# Patient Record
Sex: Male | Born: 1975 | Race: White | Hispanic: No | Marital: Single | Smoking: Former smoker
Health system: Southern US, Community
[De-identification: ages and names within clinical notes are randomized; demographics above are authoritative.]

## PROBLEM LIST (undated history)

## (undated) DIAGNOSIS — F988 Other specified behavioral and emotional disorders with onset usually occurring in childhood and adolescence: Secondary | ICD-10-CM

---

## 2011-11-17 ENCOUNTER — Telehealth: Payer: Self-pay

## 2011-11-17 DIAGNOSIS — F988 Other specified behavioral and emotional disorders with onset usually occurring in childhood and adolescence: Secondary | ICD-10-CM

## 2011-11-17 NOTE — Telephone Encounter (Signed)
.  [facility] PATIENT IS REQUESTING A REFILL ON HIS ADDERALL 30MG . HE USUALLY SEES DR. Neva Seat. PLEASE CALL HIM WHEN IT IS READY TO BE PICKED UP. HE MAY HAVE TO HAVE HIS ROOMMATE PICK IT UP FOR HIM.  HER NAME IS SARAH SADLER. SHE WILL HAVE HER ID WITH HER. BEST PHONE [phone]  MBC

## 2011-11-18 MED ORDER — AMPHETAMINE-DEXTROAMPHETAMINE 30 MG PO TABS: ORAL_TABLET | ORAL | Status: DC

## 2011-11-18 NOTE — Telephone Encounter (Signed)
Rx written and up front for pick up. Please notify pt.

## 2011-11-19 ENCOUNTER — Telehealth: Payer: Self-pay

## 2011-11-19 NOTE — Telephone Encounter (Signed)
Notified pt that his Adderall Rx is ready for p/up.

## 2011-11-19 NOTE — Telephone Encounter (Signed)
Left message on machine that prescription is ready for pick up.

## 2011-12-17 ENCOUNTER — Telehealth: Payer: Self-pay

## 2011-12-17 DIAGNOSIS — F988 Other specified behavioral and emotional disorders with onset usually occurring in childhood and adolescence: Secondary | ICD-10-CM

## 2011-12-17 NOTE — Telephone Encounter (Signed)
Pt requests refill on adderall  Best: [phone] bf

## 2011-12-18 MED ORDER — AMPHETAMINE-DEXTROAMPHETAMINE 30 MG PO TABS: ORAL_TABLET | ORAL | Status: DC

## 2011-12-18 NOTE — Telephone Encounter (Signed)
Spoke with pt advised RX ready to be picked up. 

## 2011-12-18 NOTE — Telephone Encounter (Signed)
Signed at TL desk.  

## 2012-01-10 ENCOUNTER — Telehealth: Payer: Self-pay

## 2012-01-10 NOTE — Telephone Encounter (Signed)
PT IN NEED OF HIS ADDERALL. PLEASE CALL [phone]

## 2012-01-11 NOTE — Telephone Encounter (Signed)
Pull chart please.  Nasra Counce 

## 2012-01-14 ENCOUNTER — Telehealth: Payer: Self-pay | Admitting: Internal Medicine

## 2012-01-14 DIAGNOSIS — F988 Other specified behavioral and emotional disorders with onset usually occurring in childhood and adolescence: Secondary | ICD-10-CM

## 2012-01-14 MED ORDER — AMPHETAMINE-DEXTROAMPHETAMINE 30 MG PO TABS: ORAL_TABLET | ORAL | Status: DC

## 2012-01-14 NOTE — Telephone Encounter (Signed)
Called pt advised to pick up RX and reminded him he need follow up. Pt understood.

## 2012-01-14 NOTE — Telephone Encounter (Signed)
ADDERALL RF, may be filled January 18, 2012.  DUE FOR OV FOR MORE REFILLS, please call pt to pick-up

## 2012-01-14 NOTE — Telephone Encounter (Signed)
This was already done

## 2012-01-14 NOTE — Telephone Encounter (Signed)
Chart pulled to PA 

## 2012-01-18 ENCOUNTER — Ambulatory Visit (INDEPENDENT_AMBULATORY_CARE_PROVIDER_SITE_OTHER): Payer: 59 | Admitting: Family Medicine

## 2012-01-18 ENCOUNTER — Encounter: Payer: Self-pay | Admitting: Family Medicine

## 2012-01-18 VITALS — BP 131/83 | HR 106 | Temp 98.4°F | Resp 17 | Ht 70.0 in | Wt 175.6 lb

## 2012-01-18 DIAGNOSIS — F4320 Adjustment disorder, unspecified: Secondary | ICD-10-CM

## 2012-01-18 DIAGNOSIS — F988 Other specified behavioral and emotional disorders with onset usually occurring in childhood and adolescence: Secondary | ICD-10-CM

## 2012-01-18 MED ORDER — AMPHETAMINE-DEXTROAMPHETAMINE 30 MG PO TABS: ORAL_TABLET | ORAL | Status: DC

## 2012-01-18 MED ORDER — CLONAZEPAM 0.5 MG PO TABS: 0.5000 mg | ORAL_TABLET | Freq: Two times a day (BID) | ORAL | Status: DC | PRN

## 2012-01-18 NOTE — Progress Notes (Signed)
  Subjective:    Patient ID: Joseph Blankenship, male    DOB: 1976-05-26, 36 y.o.   MRN: [id number]  HPI Joseph Blankenship is a 37 y.o. male Hx ADD. On adderrall 30mg  qam,  30mg  at 1 pm, then 1/2 prn in evenings. Feeling more anxious lately - stress with job. 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  Met with superintendent this am - difficult problem.  Stress with work and up late d/t school. Multiple applications out - but wants to stay - graduating next June.   No previous depression/anxiety or meds.  Does not want to treat long term with meds.  May be open to talk to therapist.    Review of Systems  Respiratory: Negative for chest tightness.   Cardiovascular: Negative for palpitations.  Psychiatric/Behavioral: Negative for suicidal ideas. The patient is nervous/anxious.        Objective:   Physical Exam  Constitutional: He is oriented to person, place, and time. He appears well-developed and well-nourished.  HENT:  Head: Normocephalic and atraumatic.  Cardiovascular: Normal rate, normal heart sounds and intact distal pulses.   Pulmonary/Chest: Effort normal and breath sounds normal.  Neurological: He is alert and oriented to person, place, and time.  Skin: Skin is warm and dry.  Psychiatric: He has a normal mood and affect. His speech is normal and behavior is normal. Judgment normal. Cognition and memory are normal. He expresses no homicidal and no suicidal ideation. He expresses no suicidal plans and no homicidal plans.   Assessment & Plan:  Joseph Blankenship is a 36 y.o. male   ADD -- stable - cont current dosing.  Just had refill 1 week ago?  Will check into number of meds left, and to see if he will have enough until may 5th Rx.  If he needs one week of meds prior to May 5 prescription that can be called in.    Anxiety/adjustment disorder - suspect adjustment with stressors at work. Denies history of depression or anxiety in the past.  Does have a good outlook for  jobs in the future,  however does not want to change current position. Will prescribe short course of Klonopin 0.5mg  BID prn.- sed.   Phone number for Joseph Blankenship given to patient for discussion of coping techniques and management of current situation.

## 2012-01-23 ENCOUNTER — Telehealth: Payer: Self-pay

## 2012-01-23 NOTE — Telephone Encounter (Signed)
PT WANTED DR Neva Seat TO KNOW THAT THE NUMBER HE WAS GIVEN IS THE ONE DISCONNECTED. WOULD LIKE TO HAVE SOMETHING ELSE PLEASE CALL [phone]

## 2012-01-24 NOTE — Telephone Encounter (Signed)
Dr Neva Seat, after reviewing your OV notes, it looks like the number given for Joseph Blankenship is DCd. I tried to get another number by Googling his name and found three phone numbers in total, and all have been DCd ([phone], [phone], [phone]). Do you know of any other way to reach him, or do you want to recommend another therapist?

## 2012-01-25 NOTE — Telephone Encounter (Signed)
He can try Karmen Bongo at [phone] or Vernell Leep at [phone]

## 2012-01-26 NOTE — Telephone Encounter (Signed)
GAVE PT BOTH NAMES OF THERAPIST TO TRY

## 2012-02-06 ENCOUNTER — Telehealth: Payer: Self-pay

## 2012-02-06 NOTE — Telephone Encounter (Signed)
Per phone message he wants add meds to get him through 02/17/12 and states he already has his rx for may.  ???????

## 2012-02-06 NOTE — Telephone Encounter (Signed)
Patient took the medicine wrong so he ran out early.

## 2012-02-06 NOTE — Telephone Encounter (Signed)
Pt is requesting rx for adderall just to get him thru til may 5th, he states he has his rx already for may.

## 2012-02-22 ENCOUNTER — Ambulatory Visit: Payer: 59 | Admitting: Family Medicine

## 2012-02-29 ENCOUNTER — Ambulatory Visit (INDEPENDENT_AMBULATORY_CARE_PROVIDER_SITE_OTHER): Payer: 59 | Admitting: Family Medicine

## 2012-02-29 ENCOUNTER — Encounter: Payer: Self-pay | Admitting: Family Medicine

## 2012-02-29 VITALS — BP 113/78 | HR 105 | Temp 97.8°F | Resp 16 | Ht 69.5 in | Wt 176.6 lb

## 2012-02-29 DIAGNOSIS — F419 Anxiety disorder, unspecified: Secondary | ICD-10-CM

## 2012-02-29 DIAGNOSIS — F411 Generalized anxiety disorder: Secondary | ICD-10-CM

## 2012-02-29 DIAGNOSIS — F988 Other specified behavioral and emotional disorders with onset usually occurring in childhood and adolescence: Secondary | ICD-10-CM

## 2012-02-29 MED ORDER — CLONAZEPAM 0.5 MG PO TABS: 0.5000 mg | ORAL_TABLET | Freq: Two times a day (BID) | ORAL | Status: DC | PRN

## 2012-02-29 NOTE — Progress Notes (Signed)
  Subjective:    Patient ID: Joseph Blankenship, male    DOB: 01-16-1976, 36 y.o.   MRN: [id number]  HPI Joseph Blankenship is a 36 y.o. male Hx ADD and anxiety. See last ov  01/23/12.   stress with work environment.  ADD -- stable prior  - continued current dosing (adderrall 30mg  qam,  30mg  at 1 pm, then 1/2 prn in evenings).    Anxiety/adjustment disorder - Klonopin 0.5mg  BID prn prescribed last ov - took 2 on some days, usually 2-3 per day, but doesn't want to develop a dependency.    Phone number for therapists given. Therapist - Karmen Bongo - 2 visits.  Working on coping mechanisms. Feels more introspective and able to deal with stressors better.  Last klonopin. Stress relief - breathing exercises, reading.   Review of Systems  Psychiatric/Behavioral: Negative for dysphoric mood and decreased concentration. The patient is nervous/anxious.       Objective:   Physical Exam  Constitutional: He is oriented to person, place, and time. He appears well-developed and well-nourished.  HENT:  Head: Normocephalic and atraumatic.  Pulmonary/Chest: Effort normal.  Neurological: He is alert and oriented to person, place, and time.  Psychiatric: He has a normal mood and affect. His behavior is normal. Judgment and thought content normal.          Assessment & Plan:  Joseph Blankenship is a 36 y.o. male  Anxiety/work stressors - improved with coping mechanisms.  Graduates in about a year, and will be in a new position after that time, as well as other opportunities, so has an improved outlook.. Continue Klonopin only as needed for higher stress days #20, no refills.  Recheck in 4-6 weeks.

## 2012-04-04 ENCOUNTER — Encounter: Payer: Self-pay | Admitting: Family Medicine

## 2012-04-04 ENCOUNTER — Ambulatory Visit (INDEPENDENT_AMBULATORY_CARE_PROVIDER_SITE_OTHER): Payer: 59 | Admitting: Family Medicine

## 2012-04-04 VITALS — BP 127/80 | HR 99 | Temp 97.8°F | Resp 16 | Ht 69.0 in | Wt 177.0 lb

## 2012-04-04 DIAGNOSIS — F411 Generalized anxiety disorder: Secondary | ICD-10-CM

## 2012-04-04 DIAGNOSIS — H60339 Swimmer's ear, unspecified ear: Secondary | ICD-10-CM

## 2012-04-04 DIAGNOSIS — F988 Other specified behavioral and emotional disorders with onset usually occurring in childhood and adolescence: Secondary | ICD-10-CM

## 2012-04-04 DIAGNOSIS — F419 Anxiety disorder, unspecified: Secondary | ICD-10-CM

## 2012-04-04 DIAGNOSIS — Z91030 Bee allergy status: Secondary | ICD-10-CM

## 2012-04-04 DIAGNOSIS — H6090 Unspecified otitis externa, unspecified ear: Secondary | ICD-10-CM

## 2012-04-04 MED ORDER — EPINEPHRINE 0.3 MG/0.3ML IJ DEVI: 0.3000 mg | Freq: Once | INTRAMUSCULAR | Status: AC

## 2012-04-04 MED ORDER — OFLOXACIN 0.3 % OT SOLN: 10.0000 [drp] | Freq: Every day | OTIC | Status: AC

## 2012-04-04 MED ORDER — CLONAZEPAM 0.5 MG PO TABS: 0.5000 mg | ORAL_TABLET | Freq: Two times a day (BID) | ORAL | Status: DC | PRN

## 2012-04-04 NOTE — Progress Notes (Signed)
  Subjective:    Patient ID: Joseph Blankenship, male    DOB: 1976/03/17, 36 y.o.   MRN: [id number]  HPI Joseph Blankenship is a 36 y.o. male See 02/29/12 office visit. ADD - current dosing adderrall 30mg  qam,  30mg  at 1 pm, then 1/2 prn in evenings.  No recent change in symptoms or doses changed recently.   Adjustment disorder/anxiety - stress with work environment. Klonopin 0.5mg  BID prn.  Therapist - Joseph Blankenship - 2 visits.  Working on coping mechanisms. Felt  more introspective and able to deal with stressors at last office visit. Stress relief - breathing exercises, reading. Avoiding supervisor - seems to work best. Doing what needs to be done, but still difficult communication.  Feeling like doing a good job, but doing more than his fair share.  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 Still following up with Joseph Blankenship. Rare klonopin.  Has 8 left.  Maybe once per week.   Planning on rafting down river June 29th - July 9th - in [place]. Hx of earaches or cold exposure. infection? In past with wind. Needs drops for possible swimmers ear, and new epipen.     Review of Systems  HENT: Negative for hearing loss and ear discharge.        No current pain, but by history - has had otitis externa and pain with cold/wind exposure prior.  Will be rafting on river far from medical care.    Skin: Negative for rash.  Psychiatric/Behavioral: Negative for suicidal ideas, dysphoric mood and decreased concentration. The patient is not nervous/anxious.        ADD doing well on current doses.       Objective:   Physical Exam  Constitutional: He is oriented to person, place, and time. He appears well-developed and well-nourished.  HENT:  Head: Normocephalic and atraumatic.  Eyes: Pupils are equal, round, and reactive to light.  Neck: No thyromegaly present.  Cardiovascular: Normal rate, regular rhythm, normal heart sounds and intact  distal pulses.   Pulmonary/Chest: Effort normal and breath sounds normal.  Lymphadenopathy:    He has no cervical adenopathy.  Neurological: He is alert and oriented to person, place, and time.  Skin: Skin is warm and dry. No rash noted.  Psychiatric: He has a normal mood and affect. His behavior is normal. Judgment and thought content normal.       Assessment & Plan:  Joseph Blankenship is a 36 y.o. male 1. Anxiety   2. 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 ADD (attention deficit disorder)    Anxiety - work stressors - handling well.  Rare Klonopin - refilled.  Cont counseling - especially if seeking executive position as anticipate increase in stressors, and plan for coping techniques and stress management important.   ADD - stable. ok to refill meds for 3 more months if needed.   Hx bee allergy - epipen x2 rx, with 1 refill if needed, use and immediate follow up discussed.  Hx of otalgia/OE - ear plugs or cover if cold/wind exposure.  Floxin otic only if needed.   Recheck in 3 months.

## 2012-04-20 ENCOUNTER — Telehealth: Payer: Self-pay

## 2012-04-20 DIAGNOSIS — F988 Other specified behavioral and emotional disorders with onset usually occurring in childhood and adolescence: Secondary | ICD-10-CM

## 2012-04-20 MED ORDER — AMPHETAMINE-DEXTROAMPHETAMINE 30 MG PO TABS: ORAL_TABLET | ORAL | Status: DC

## 2012-04-20 NOTE — Telephone Encounter (Signed)
lmom that rx is ready for pickup.  

## 2012-04-20 NOTE — Telephone Encounter (Signed)
rx ready to pick up, but won't be able to fill until on or after July 19th

## 2012-04-20 NOTE — Telephone Encounter (Signed)
PT IS CALLING FOR A REFILL ON ADDERROL PLEASE CALL PT WHEN READY

## 2012-04-24 ENCOUNTER — Telehealth: Payer: Self-pay

## 2012-04-24 DIAGNOSIS — F988 Other specified behavioral and emotional disorders with onset usually occurring in childhood and adolescence: Secondary | ICD-10-CM

## 2012-04-24 NOTE — Telephone Encounter (Signed)
PT STATES THAT HE CAME IN TO PICK UP HIS ADDERALL TODAY AND THE DATE THE RX HAS FOR IT TO BE FILLED IS 05/02/12, PT STATES THAT HE NORMALLY FILLS HIS ADDERALL ON THE 5TH OF EACH MONTH AND WOULD LIKE TO HAVE THIS CHANGED. [phone]

## 2012-04-25 MED ORDER — AMPHETAMINE-DEXTROAMPHETAMINE 30 MG PO TABS: ORAL_TABLET | ORAL | Status: DC

## 2012-04-25 NOTE — Telephone Encounter (Signed)
LMOM that new Rx is ready for p/up

## 2012-04-25 NOTE — Telephone Encounter (Signed)
Last refill was 03/19/12, so patient should be able to have this filled on 04/18/12. Will shred original Rx that stated not to fill until 05/02/12. New Rx printed.

## 2012-05-20 NOTE — Telephone Encounter (Signed)
Pt is requesting rx refill on adderall please call when ready for pick-up [phone]

## 2012-05-23 MED ORDER — AMPHETAMINE-DEXTROAMPHETAMINE 30 MG PO TABS: ORAL_TABLET | ORAL | Status: DC

## 2012-05-23 NOTE — Telephone Encounter (Signed)
Pt is requesting rx refill on his adderall medication.  Please call when ready for pickup.  [phone].

## 2012-05-23 NOTE — Telephone Encounter (Signed)
Notified pt Rx ready for p/up 

## 2012-05-23 NOTE — Telephone Encounter (Signed)
Signed and at TL desk

## 2012-06-05 ENCOUNTER — Telehealth: Payer: Self-pay

## 2012-06-05 MED ORDER — CLONAZEPAM 0.5 MG PO TABS: 0.5000 mg | ORAL_TABLET | Freq: Two times a day (BID) | ORAL | Status: DC | PRN

## 2012-06-05 NOTE — Telephone Encounter (Signed)
Done and printed

## 2012-06-05 NOTE — Telephone Encounter (Signed)
PT IN NEED OF HIS KLONOPIN. PLEASE CALL [phone]

## 2012-06-05 NOTE — Telephone Encounter (Signed)
Faxed in Rx. Called pt, [facility] RX sent in

## 2012-06-19 ENCOUNTER — Telehealth: Payer: Self-pay

## 2012-06-19 DIAGNOSIS — F988 Other specified behavioral and emotional disorders with onset usually occurring in childhood and adolescence: Secondary | ICD-10-CM

## 2012-06-19 MED ORDER — AMPHETAMINE-DEXTROAMPHETAMINE 30 MG PO TABS: ORAL_TABLET | ORAL | Status: DC

## 2012-06-19 NOTE — Telephone Encounter (Signed)
Josh called to request rx refill for Adderall 30 mg. Please call [phone] when rx is ready for pick up.

## 2012-06-19 NOTE — Telephone Encounter (Signed)
Was seen 04/04/12 Psychiatric/Behavioral: Negative for suicidal ideas, dysphoric mood and decreased concentration. The patient is not nervous/anxious.  ADD doing well on current doses.  Helmut Muster PA   Needs renewal for Adderall 30mg 

## 2012-06-19 NOTE — Telephone Encounter (Signed)
Done and printed

## 2012-06-19 NOTE — Telephone Encounter (Signed)
LMOM RX ready for pickup 

## 2012-07-04 ENCOUNTER — Ambulatory Visit: Payer: 59 | Admitting: Family Medicine

## 2012-07-15 ENCOUNTER — Other Ambulatory Visit: Payer: Self-pay | Admitting: Physician Assistant

## 2012-07-18 ENCOUNTER — Telehealth: Payer: Self-pay

## 2012-07-18 DIAGNOSIS — F988 Other specified behavioral and emotional disorders with onset usually occurring in childhood and adolescence: Secondary | ICD-10-CM

## 2012-07-18 MED ORDER — AMPHETAMINE-DEXTROAMPHETAMINE 30 MG PO TABS: ORAL_TABLET | ORAL | Status: DC

## 2012-07-18 MED ORDER — CLONAZEPAM 0.5 MG PO TABS: 0.5000 mg | ORAL_TABLET | Freq: Two times a day (BID) | ORAL | Status: DC | PRN

## 2012-07-18 NOTE — Telephone Encounter (Signed)
The patient called to request refill of Klonopin and Adderall.  Please call the patient at [phone] when ready for pick up.

## 2012-07-18 NOTE — Telephone Encounter (Signed)
Adderall 30mg  1 am 1 afternoon and 1/2 at night

## 2012-07-18 NOTE — Telephone Encounter (Signed)
Rx/s printed. Need office visit for additional refills.

## 2012-07-19 NOTE — Telephone Encounter (Signed)
LMOM RX ready to pick up. 

## 2012-08-04 ENCOUNTER — Ambulatory Visit (INDEPENDENT_AMBULATORY_CARE_PROVIDER_SITE_OTHER): Payer: 59 | Admitting: Family Medicine

## 2012-08-04 ENCOUNTER — Encounter: Payer: Self-pay | Admitting: Family Medicine

## 2012-08-04 VITALS — BP 110/68 | HR 89 | Temp 98.2°F | Resp 16 | Ht 70.0 in | Wt 173.4 lb

## 2012-08-04 DIAGNOSIS — F419 Anxiety disorder, unspecified: Secondary | ICD-10-CM

## 2012-08-04 DIAGNOSIS — F988 Other specified behavioral and emotional disorders with onset usually occurring in childhood and adolescence: Secondary | ICD-10-CM

## 2012-08-04 DIAGNOSIS — F411 Generalized anxiety disorder: Secondary | ICD-10-CM

## 2012-08-04 MED ORDER — AMPHETAMINE-DEXTROAMPHETAMINE 30 MG PO TABS: ORAL_TABLET | ORAL | Status: DC

## 2012-08-04 MED ORDER — CLONAZEPAM 0.5 MG PO TABS: 0.5000 mg | ORAL_TABLET | Freq: Two times a day (BID) | ORAL | Status: DC | PRN

## 2012-08-04 NOTE — Progress Notes (Signed)
  Subjective:    Patient ID: Joseph Blankenship, male    DOB: 1976-05-11, 36 y.o.   MRN: [id number]  HPI Joseph Blankenship is a 36 y.o. male Last ov 04/04/12.  ADD - still taking adderall 30mg  am, afternoon, 1/2 at night.  Has to stay up late for schoolwork, no insomnia with meds.  Appetite ok.   Situational anxiety with work stressors. Adjustment disorder/anxiety - stress with work environment. Taking klonopin as needed - on average every third day - one pill every few days usually, some days needing 2-3.  Sometimes none for weeks. Stressors are improving overall slowly. Still in same job position, not sure if prior opportunity is actually available. Therapist - Karmen Bongo - about every 2 weeks.  Helping with stress mgt.   Review of Systems  Constitutional: Negative for appetite change.  Respiratory: Negative for chest tightness.   Cardiovascular: Negative for chest pain and palpitations.  Psychiatric/Behavioral: Negative for disturbed wake/sleep cycle and dysphoric mood. The patient is nervous/anxious (as above.).        Objective:   Physical Exam  Constitutional: He is oriented to person, place, and time. He appears well-developed and well-nourished.  HENT:  Head: Normocephalic and atraumatic.  Eyes: EOM are normal. Pupils are equal, round, and reactive to light.  Cardiovascular: Normal rate, regular rhythm, normal heart sounds and intact distal pulses.   No extrasystoles are present.  No murmur heard. Pulmonary/Chest: Effort normal and breath sounds normal.  Neurological: He is alert and oriented to person, place, and time.  Skin: Skin is warm and dry.  Psychiatric: He has a normal mood and affect. His behavior is normal. Judgment and thought content normal.       Assessment & Plan:  Joseph Blankenship is a 36 y.o. male 1. ADD (attention deficit disorder)  amphetamine-dextroamphetamine (ADDERALL) 30 MG tablet, DISCONTINUED: amphetamine-dextroamphetamine (ADDERALL) 30 MG  tablet, DISCONTINUED: amphetamine-dextroamphetamine (ADDERALL) 30 MG tablet  2. Anxiety  clonazePAM (KLONOPIN) 0.5 MG tablet   ADD - controlled - 3 months med rx starting 08/19/12. Refill until ov 5/14  Anxiety/stress - symptomatically improved. Discussed continual work on coping techniques and should have challenges posed from counseling.  Continue counseling, klonopin prn flairs.  Can defer SSRi if continual improvement with counseling and mgt of stressors.   Recheck in 6 months.

## 2012-08-04 NOTE — Patient Instructions (Signed)
recheck in 6 months.  Will need to call for last 3 months of refills. If requiring klonopin more frequently - return sooner. Return to the clinic or go to the nearest emergency room if any of your symptoms worsen or new symptoms occur.

## 2012-11-20 ENCOUNTER — Telehealth: Payer: Self-pay

## 2012-11-20 DIAGNOSIS — F988 Other specified behavioral and emotional disorders with onset usually occurring in childhood and adolescence: Secondary | ICD-10-CM

## 2012-11-20 NOTE — Telephone Encounter (Signed)
amphetamine-dextroamphetamine (ADDERALL) 30 MG tablet Refill    [phone]

## 2012-11-21 MED ORDER — AMPHETAMINE-DEXTROAMPHETAMINE 30 MG PO TABS: ORAL_TABLET | ORAL | Status: DC

## 2012-11-21 NOTE — Telephone Encounter (Signed)
Ready for pick up

## 2012-11-21 NOTE — Telephone Encounter (Signed)
Notified pt on VM Rx is ready for p/up. 

## 2012-12-21 ENCOUNTER — Telehealth: Payer: Self-pay

## 2012-12-21 DIAGNOSIS — F988 Other specified behavioral and emotional disorders with onset usually occurring in childhood and adolescence: Secondary | ICD-10-CM

## 2012-12-21 NOTE — Telephone Encounter (Signed)
Patient needs refill on adderall. Please call when ready for pickup. [phone]

## 2012-12-22 MED ORDER — AMPHETAMINE-DEXTROAMPHETAMINE 30 MG PO TABS: ORAL_TABLET | ORAL | Status: DC

## 2012-12-22 NOTE — Telephone Encounter (Signed)
Pended. Please advise, pt due for follow up in April.

## 2012-12-23 NOTE — Telephone Encounter (Signed)
lmom that rx is ready for pickup and that he is due for follow up in April

## 2013-01-14 ENCOUNTER — Other Ambulatory Visit: Payer: Self-pay | Admitting: Family Medicine

## 2013-01-15 ENCOUNTER — Other Ambulatory Visit: Payer: Self-pay | Admitting: Family Medicine

## 2013-01-16 ENCOUNTER — Telehealth: Payer: Self-pay

## 2013-01-16 NOTE — Telephone Encounter (Signed)
Pharm requests RF of clonazepam 0.5 mg. Pt has appt sch for 02/02/13.

## 2013-01-16 NOTE — Telephone Encounter (Signed)
Faxed,

## 2013-01-27 ENCOUNTER — Telehealth: Payer: Self-pay

## 2013-01-27 DIAGNOSIS — F988 Other specified behavioral and emotional disorders with onset usually occurring in childhood and adolescence: Secondary | ICD-10-CM

## 2013-01-27 NOTE — Telephone Encounter (Signed)
PATIENT STATES HE HAS BEEN TRYING FOR 3 DAYS TO GET A REFILL ON HIS ADDERALL 30MG  PRESCRIPTION. HE HAS AN APPOINTMENT TO SEE DR. GREENE NEXT Monday. BEST PHONE [phone] (CELL)  PLEASE CALL HIM WHEN IT IS READY TO BE PICKED UP.   MBC

## 2013-01-28 MED ORDER — AMPHETAMINE-DEXTROAMPHETAMINE 30 MG PO TABS: ORAL_TABLET | ORAL | Status: DC

## 2013-01-28 NOTE — Telephone Encounter (Signed)
Called him to advise rx ready, apologized for delay, explained this was first message I have gotten.

## 2013-01-28 NOTE — Telephone Encounter (Signed)
This is first request I have gotten. Please advise on refill, pended

## 2013-01-28 NOTE — Telephone Encounter (Signed)
Done.  Keep follow up.

## 2013-02-02 ENCOUNTER — Encounter: Payer: Self-pay | Admitting: *Deleted

## 2013-02-02 ENCOUNTER — Ambulatory Visit: Payer: 59 | Admitting: Family Medicine

## 2013-02-19 ENCOUNTER — Telehealth: Payer: Self-pay

## 2013-02-19 DIAGNOSIS — F988 Other specified behavioral and emotional disorders with onset usually occurring in childhood and adolescence: Secondary | ICD-10-CM

## 2013-02-19 MED ORDER — AMPHETAMINE-DEXTROAMPHETAMINE 30 MG PO TABS: ORAL_TABLET | ORAL | Status: DC

## 2013-02-19 NOTE — Telephone Encounter (Signed)
Patient would like adderall refill.

## 2013-02-19 NOTE — Telephone Encounter (Signed)
Will refill as appt scheduled.

## 2013-02-19 NOTE — Telephone Encounter (Signed)
Patient due for follow up, what is his plan? I spoke to him, he states he missed his appt last week, now has appt for  June 9th please advise. Pended.

## 2013-03-01 ENCOUNTER — Other Ambulatory Visit: Payer: Self-pay | Admitting: Family Medicine

## 2013-03-03 ENCOUNTER — Telehealth: Payer: Self-pay

## 2013-03-03 NOTE — Telephone Encounter (Signed)
Pharm requests RF of clonazepam 0.5 mg BID prn. Pt has appt sch for 03/23/13. Dr Neva Seat, do you want to RF?

## 2013-03-04 NOTE — Telephone Encounter (Signed)
Refilled - has ov scheduled.

## 2013-03-05 ENCOUNTER — Other Ambulatory Visit: Payer: Self-pay | Admitting: Radiology

## 2013-03-05 NOTE — Telephone Encounter (Signed)
Faxed rx

## 2013-03-23 ENCOUNTER — Ambulatory Visit (INDEPENDENT_AMBULATORY_CARE_PROVIDER_SITE_OTHER): Payer: 59 | Admitting: Family Medicine

## 2013-03-23 ENCOUNTER — Encounter: Payer: Self-pay | Admitting: Family Medicine

## 2013-03-23 VITALS — BP 130/72 | HR 81 | Temp 98.6°F | Resp 16 | Ht 70.0 in | Wt 179.2 lb

## 2013-03-23 DIAGNOSIS — F418 Other specified anxiety disorders: Secondary | ICD-10-CM

## 2013-03-23 DIAGNOSIS — F988 Other specified behavioral and emotional disorders with onset usually occurring in childhood and adolescence: Secondary | ICD-10-CM

## 2013-03-23 DIAGNOSIS — B360 Pityriasis versicolor: Secondary | ICD-10-CM

## 2013-03-23 DIAGNOSIS — F411 Generalized anxiety disorder: Secondary | ICD-10-CM

## 2013-03-23 MED ORDER — KETOCONAZOLE 200 MG PO TABS: 400.0000 mg | ORAL_TABLET | Freq: Once | ORAL | Status: DC

## 2013-03-23 MED ORDER — AMPHETAMINE-DEXTROAMPHETAMINE 30 MG PO TABS: ORAL_TABLET | ORAL | Status: DC

## 2013-03-23 NOTE — Patient Instructions (Signed)
Recheck in 6 months. Take the nizoral as discussed for the chest/back rash.  Recheck if this does not improve.  Tinea Versicolor Tinea versicolor is a common yeast infection of the skin. This condition becomes known when the yeast on our skin starts to overgrow (yeast is a normal inhabitant on our skin). This condition is noticed as white or light brown patches on brown skin, and is more evident in the summer on tanned skin. These areas are slightly scaly if scratched. The light patches from the yeast become evident when the yeast creates "holes in your suntan". This is most often noticed in the summer. The patches are usually located on the chest, back, pubis, neck and body folds. However, it may occur on any area of body. Mild itching and inflammation (redness or soreness) may be present. DIAGNOSIS  The diagnosisof this is made clinically (by looking). Cultures from samples are usually not needed. Examination under the microscope may help. However, yeast is normally found on skin. The diagnosis still remains clinical. Examination under Wood's Ultraviolet Light can determine the extent of the infection. TREATMENT  This common infection is usually only of cosmetic (only a concern to your appearance). It is easily treated with dandruff shampoo used during showers or bathing. Vigorous scrubbing will eliminate the yeast over several days time. The light areas in your skin may remain for weeks or months after the infection is cured unless your skin is exposed to sunlight. The lighter or darker spots caused by the fungus that remain after complete treatment are not a sign of treatment failure; it will take a long time to resolve. Your caregiver may recommend a number of commercial preparations or medication by mouth if home care is not working. Recurrence is common and preventative medication may be necessary. This skin condition is not highly contagious. Special care is not needed to protect close friends and  family members. Normal hygiene is usually enough. Follow up is required only if you develop complications (such as a secondary infection from scratching), if recommended by your caregiver, or if no relief is obtained from the preparations used. Document Released: 09/28/2000 Document Revised: 12/24/2011 Document Reviewed: 11/10/2008 [facility] Patient Information 2014 [place], [place].

## 2013-03-23 NOTE — Progress Notes (Signed)
Subjective:    Patient ID: Joseph Blankenship, male    DOB: [birth year], 37 y.o.   MRN: [id number]  HPI Joseph Blankenship is a 37 y.o. male  ADD- taking Adderall 1 in am, 1 in early afternoon, and in evening if needed.  Has been weaning as out of school.  Now mostly morning/afternoon, with occasional evening dose if working on project for work. Maybe one per week.   Anxiety - improved.  Graduated in May.  Catching up on sleep - starting to look for jobs around here. Possibly [place]. Klonopin - less use now that out of school. Once a week or two. Last filled #20 on May 18th. Still has 12 or 14 left. No SI, rare alcohol - 3 beers in last 4 months.  Quitting smoking with use of vaporizer pen - 3 cigs last week. Has cut back nicotine.   Rash - comes and goes - more in summer.  on chest - tried otc dreams - no relief. Upper chest and back.   Review of Systems  Constitutional: Negative for appetite change.  Respiratory: Negative for chest tightness.   Cardiovascular: Negative for chest pain and palpitations.  Psychiatric/Behavioral: Negative for sleep disturbance and dysphoric mood. The patient is nervous/anxious (less anxious out of school. ).        Objective:   Physical Exam  Vitals reviewed. Constitutional: He is oriented to person, place, and time. He appears well-developed and well-nourished.  HENT:  Head: Normocephalic and atraumatic.  Eyes: EOM are normal. Pupils are equal, round, and reactive to light.  Cardiovascular: Normal rate, regular rhythm, normal heart sounds and intact distal pulses.   No extrasystoles are present.  No murmur heard. Pulmonary/Chest: Effort normal and breath sounds normal.  Neurological: He is alert and oriented to person, place, and time.  Skin: Skin is warm and dry.  Psychiatric: He has a normal mood and affect. His behavior is normal. Judgment and thought content normal.       Assessment & Plan:  Joseph Blankenship is a 37 y.o. male Tinea versicolor  - Rx Nizoral 400mg  x1.  rtc precautions.   ADD (attention deficit disorder) - stable. Ok to continue am and afternoon dosing for work, with episodic evening dose for projects.  3 months rx given., then refill for 3 more months.   Situational anxiety - has rx for klonopin if needed. Call if refill needed.   Patient Instructions  Recheck in 6 months. Take the nizoral as discussed for the chest/back rash.  Recheck if this does not improve.  Tinea Versicolor Tinea versicolor is a common yeast infection of the skin. This condition becomes known when the yeast on our skin starts to overgrow (yeast is a normal inhabitant on our skin). This condition is noticed as white or light brown patches on brown skin, and is more evident in the summer on tanned skin. These areas are slightly scaly if scratched. The light patches from the yeast become evident when the yeast creates "holes in your suntan". This is most often noticed in the summer. The patches are usually located on the chest, back, pubis, neck and body folds. However, it may occur on any area of body. Mild itching and inflammation (redness or soreness) may be present. DIAGNOSIS  The diagnosisof this is made clinically (by looking). Cultures from samples are usually not needed. Examination under the microscope may help. However, yeast is normally found on skin. The diagnosis still remains clinical. Examination under Wood's Ultraviolet  Light can determine the extent of the infection. TREATMENT  This common infection is usually only of cosmetic (only a concern to your appearance). It is easily treated with dandruff shampoo used during showers or bathing. Vigorous scrubbing will eliminate the yeast over several days time. The light areas in your skin may remain for weeks or months after the infection is cured unless your skin is exposed to sunlight. The lighter or darker spots caused by the fungus that remain after complete treatment are not a sign of treatment  failure; it will take a long time to resolve. Your caregiver may recommend a number of commercial preparations or medication by mouth if home care is not working. Recurrence is common and preventative medication may be necessary. This skin condition is not highly contagious. Special care is not needed to protect close friends and family members. Normal hygiene is usually enough. Follow up is required only if you develop complications (such as a secondary infection from scratching), if recommended by your caregiver, or if no relief is obtained from the preparations used. Document Released: 09/28/2000 Document Revised: 12/24/2011 Document Reviewed: 11/10/2008 [facility] Patient Information 2014 [place], [place].

## 2013-04-09 ENCOUNTER — Other Ambulatory Visit: Payer: Self-pay | Admitting: Family Medicine

## 2013-04-09 DIAGNOSIS — F418 Other specified anxiety disorders: Secondary | ICD-10-CM

## 2013-04-10 ENCOUNTER — Other Ambulatory Visit: Payer: Self-pay | Admitting: Radiology

## 2013-04-10 NOTE — Telephone Encounter (Signed)
Done.  Ready for pick-up 

## 2013-05-06 ENCOUNTER — Other Ambulatory Visit: Payer: Self-pay | Admitting: Family Medicine

## 2013-05-06 DIAGNOSIS — F411 Generalized anxiety disorder: Secondary | ICD-10-CM

## 2013-05-06 NOTE — Telephone Encounter (Signed)
Forward to Dr. Greene. 

## 2013-05-09 NOTE — Telephone Encounter (Signed)
Done. Will be ready for pickup later today.

## 2013-06-24 ENCOUNTER — Telehealth: Payer: Self-pay

## 2013-06-24 DIAGNOSIS — F988 Other specified behavioral and emotional disorders with onset usually occurring in childhood and adolescence: Secondary | ICD-10-CM

## 2013-06-24 NOTE — Telephone Encounter (Signed)
Patient requestng refill on his adderall please call him at [phone]

## 2013-06-24 NOTE — Telephone Encounter (Signed)
Please advise, pended.  

## 2013-06-26 NOTE — Telephone Encounter (Signed)
Ok to refill, but please have other provider Rx as I am out of town.

## 2013-06-27 MED ORDER — AMPHETAMINE-DEXTROAMPHETAMINE 30 MG PO TABS: ORAL_TABLET | ORAL | Status: DC

## 2013-06-27 NOTE — Telephone Encounter (Signed)
Rx signed at [facility].  Follow up with Dr. Neva Seat as planned

## 2013-06-28 NOTE — Telephone Encounter (Signed)
Left message to return call 

## 2013-06-30 NOTE — Telephone Encounter (Signed)
lmom that Adderall ready for p/u.

## 2013-07-05 ENCOUNTER — Other Ambulatory Visit: Payer: Self-pay | Admitting: Family Medicine

## 2013-07-08 ENCOUNTER — Other Ambulatory Visit: Payer: Self-pay

## 2013-07-31 ENCOUNTER — Encounter: Payer: Self-pay | Admitting: Internal Medicine

## 2013-07-31 ENCOUNTER — Observation Stay (HOSPITAL_COMMUNITY)
Admission: EM | Admit: 2013-07-31 | Discharge: 2013-08-01 | Disposition: A | Discharge status: Home / Self Care | Payer: 59 | Attending: Emergency Medicine | Admitting: Emergency Medicine

## 2013-07-31 ENCOUNTER — Encounter: Payer: Self-pay | Admitting: Family Medicine

## 2013-07-31 ENCOUNTER — Ambulatory Visit (HOSPITAL_COMMUNITY): Admit: 2013-07-31 | Payer: Self-pay | Admitting: Cardiology

## 2013-07-31 ENCOUNTER — Encounter (HOSPITAL_COMMUNITY): Payer: Self-pay | Admitting: Emergency Medicine

## 2013-07-31 ENCOUNTER — Emergency Department (HOSPITAL_COMMUNITY): Payer: 59

## 2013-07-31 ENCOUNTER — Encounter (HOSPITAL_COMMUNITY)
Admission: EM | Disposition: A | Discharge status: Home / Self Care | Payer: Self-pay | Source: Home / Self Care | Attending: Emergency Medicine

## 2013-07-31 ENCOUNTER — Ambulatory Visit (INDEPENDENT_AMBULATORY_CARE_PROVIDER_SITE_OTHER): Payer: 59 | Admitting: Internal Medicine

## 2013-07-31 VITALS — BP 118/80 | HR 106 | Temp 98.4°F | Resp 20 | Ht 70.0 in | Wt 179.0 lb

## 2013-07-31 DIAGNOSIS — R0602 Shortness of breath: Secondary | ICD-10-CM | POA: Insufficient documentation

## 2013-07-31 DIAGNOSIS — F988 Other specified behavioral and emotional disorders with onset usually occurring in childhood and adolescence: Secondary | ICD-10-CM | POA: Diagnosis present

## 2013-07-31 DIAGNOSIS — R079 Chest pain, unspecified: Secondary | ICD-10-CM

## 2013-07-31 DIAGNOSIS — Z87891 Personal history of nicotine dependence: Secondary | ICD-10-CM | POA: Insufficient documentation

## 2013-07-31 DIAGNOSIS — Z79899 Other long term (current) drug therapy: Secondary | ICD-10-CM | POA: Insufficient documentation

## 2013-07-31 DIAGNOSIS — Z9109 Other allergy status, other than to drugs and biological substances: Secondary | ICD-10-CM | POA: Insufficient documentation

## 2013-07-31 DIAGNOSIS — Z8709 Personal history of other diseases of the respiratory system: Secondary | ICD-10-CM | POA: Insufficient documentation

## 2013-07-31 DIAGNOSIS — J3489 Other specified disorders of nose and nasal sinuses: Secondary | ICD-10-CM | POA: Insufficient documentation

## 2013-07-31 DIAGNOSIS — R072 Precordial pain: Secondary | ICD-10-CM

## 2013-07-31 DIAGNOSIS — I249 Acute ischemic heart disease, unspecified: Secondary | ICD-10-CM

## 2013-07-31 DIAGNOSIS — I200 Unstable angina: Secondary | ICD-10-CM

## 2013-07-31 DIAGNOSIS — I319 Disease of pericardium, unspecified: Principal | ICD-10-CM | POA: Diagnosis present

## 2013-07-31 DIAGNOSIS — I219 Acute myocardial infarction, unspecified: Secondary | ICD-10-CM

## 2013-07-31 DIAGNOSIS — R9431 Abnormal electrocardiogram [ECG] [EKG]: Secondary | ICD-10-CM | POA: Diagnosis present

## 2013-07-31 HISTORY — DX: Other specified behavioral and emotional disorders with onset usually occurring in childhood and adolescence: F98.8

## 2013-07-31 LAB — CBC WITH DIFFERENTIAL/PLATELET
Basophils Relative: 1 % (ref 0–1)
Eosinophils Absolute: 0.5 10*3/uL (ref 0.0–0.7)
HCT: 45.3 % (ref 39.0–52.0)
Hemoglobin: 16.5 g/dL (ref 13.0–17.0)
Lymphs Abs: 2.6 10*3/uL (ref 0.7–4.0)
MCHC: 36.4 g/dL — ABNORMAL HIGH (ref 30.0–36.0)
Monocytes Absolute: 1.1 10*3/uL — ABNORMAL HIGH (ref 0.1–1.0)
Monocytes Relative: 10 % (ref 3–12)
Neutro Abs: 7.1 10*3/uL (ref 1.7–7.7)
Neutrophils Relative %: 62 % (ref 43–77)
RBC: 5.23 MIL/uL (ref 4.22–5.81)
WBC: 11.4 10*3/uL — ABNORMAL HIGH (ref 4.0–10.5)

## 2013-07-31 LAB — COMPREHENSIVE METABOLIC PANEL
ALT: 14 U/L (ref 0–53)
AST: 19 U/L (ref 0–37)
Albumin: 4.2 g/dL (ref 3.5–5.2)
Alkaline Phosphatase: 123 U/L — ABNORMAL HIGH (ref 39–117)
BUN: 15 mg/dL (ref 6–23)
Chloride: 102 mEq/L (ref 96–112)
Potassium: 4.3 mEq/L (ref 3.5–5.1)
Sodium: 138 mEq/L (ref 135–145)
Total Bilirubin: 0.3 mg/dL (ref 0.3–1.2)
Total Protein: 7.8 g/dL (ref 6.0–8.3)

## 2013-07-31 LAB — TROPONIN I
Troponin I: <0.3 ng/mL (ref <0.30)
Troponin I: <0.3 ng/mL (ref <0.30)

## 2013-07-31 LAB — PROTIME-INR
INR: 0.97 (ref 0.00–1.49)
Prothrombin Time: 12.7 seconds (ref 11.6–15.2)

## 2013-07-31 LAB — TSH: TSH: 0.683 u[IU]/mL (ref 0.350–4.500)

## 2013-07-31 SURGERY — LEFT HEART CATHETERIZATION WITH CORONARY ANGIOGRAM
Anesthesia: Moderate Sedation

## 2013-07-31 MED ORDER — AMPHETAMINE-DEXTROAMPHETAMINE 30 MG PO TABS: 30.0000 mg | ORAL_TABLET | Freq: Three times a day (TID) | ORAL | Status: DC

## 2013-07-31 MED ORDER — AMPHETAMINE-DEXTROAMPHETAMINE 10 MG PO TABS: 30.0000 mg | ORAL_TABLET | Freq: Every day | ORAL | Status: DC | PRN

## 2013-07-31 MED ORDER — AMPHETAMINE-DEXTROAMPHETAMINE 30 MG PO TABS
30.0000 mg | ORAL_TABLET | ORAL | Status: DC
  Filled 2013-07-31 (×3): qty 1

## 2013-07-31 MED ORDER — NAPROXEN 500 MG PO TABS
500.0000 mg | ORAL_TABLET | Freq: Two times a day (BID) | ORAL | Status: DC
  Administered 2013-07-31: 500 mg via ORAL
  Filled 2013-07-31 (×4): qty 1

## 2013-07-31 MED ORDER — ASPIRIN 81 MG PO CHEW: 324.0000 mg | CHEWABLE_TABLET | Freq: Once | ORAL | Status: DC

## 2013-07-31 MED ORDER — KETOROLAC TROMETHAMINE 30 MG/ML IJ SOLN: 30.0000 mg | Freq: Once | INTRAMUSCULAR | Status: DC

## 2013-07-31 MED ORDER — PANTOPRAZOLE SODIUM 40 MG PO TBEC
40.0000 mg | DELAYED_RELEASE_TABLET | Freq: Every day | ORAL | Status: DC
  Administered 2013-07-31: 40 mg via ORAL
  Filled 2013-07-31: qty 1

## 2013-07-31 MED ORDER — KETOROLAC TROMETHAMINE 30 MG/ML IJ SOLN: 30.0000 mg | Freq: Four times a day (QID) | INTRAMUSCULAR | Status: DC | PRN

## 2013-07-31 MED ORDER — ACETAMINOPHEN 325 MG PO TABS: 650.0000 mg | ORAL_TABLET | ORAL | Status: DC | PRN

## 2013-07-31 MED ORDER — CLONAZEPAM 0.5 MG PO TABS: 0.5000 mg | ORAL_TABLET | Freq: Two times a day (BID) | ORAL | Status: DC | PRN

## 2013-07-31 NOTE — ED Notes (Addendum)
Per ems pt came from urgent care. pt states he started having chest pain about lunch time on yesterday; states pain gets worse when he move or bend over; pain has increased over the past day; pt reports no hx cardiac problems.  Cardiologist met pt at door; STEMI cancelled and recommended Echocardiogram

## 2013-07-31 NOTE — H&P (Signed)
Patient ID: Joseph Blankenship MRN: [id number], DOB/AGE: September 18, 1976   Admit date: 07/31/2013   Primary Physician: No primary provider on file. Primary Cardiologist: Dr Herbie Baltimore (new)  HPI: 37 y/o brought [facility] the [facility] [place] as a STEMI. In ER at [facility] STEMI aborted after seeing the pt on arrival and reviewing the EKG.  The pt complains os chest pain "collar bone to collar bone". Onset yesterday. Worse when leaning forward or with deep breathing, worse when laying flat or on his side. No recent fever or illness. No associated nausea, diaphoresis, or SOB. No radiation to his arms or jaw. Currently pain free. EKG shows diffuse ST elevation.  Problem List: Past Medical History  Diagnosis Date  . ADD (attention deficit disorder)     History reviewed. No pertinent past surgical history.   Allergies:  Allergies  Allergen Reactions  . Bee Pollen    Home Medications Current Facility-Administered Medications  Medication Dose Route Frequency Provider Last Rate Last Dose  . aspirin chewable tablet 324 mg  324 mg Oral Once Glynn Octave, MD      . ketorolac (TORADOL) 30 MG/ML injection 30 mg  30 mg Intravenous Once Abelino Derrick, PA-C       Current Outpatient Prescriptions  Medication Sig Dispense Refill  . amphetamine-dextroamphetamine (ADDERALL) 30 MG tablet Take 1 in the morning; 1 at 3 pm and one in the evening if needed.  70 tablet  0  . amphetamine-dextroamphetamine (ADDERALL) 30 MG tablet Take one in the am, one at 3pm, and one in the evening (last dose if needed)  70 tablet  0  . amphetamine-dextroamphetamine (ADDERALL) 30 MG tablet Take one in the am, one at 3pm, and one in the evening (last dose if needed)  70 tablet  0  . clonazePAM (KLONOPIN) 0.5 MG tablet TAKE 1 TABLET BY MOUTH TWICE DAILY AS NEEDED FOR ANXIETY  20 tablet  0  . EPINEPHrine (EPIPEN 2-PAK) 0.3 mg/0.3 mL DEVI Inject 0.3 mLs (0.3 mg total) into the muscle once.  2 Device  1  . ketoconazole (NIZORAL) 200 MG  tablet Take 2 tablets (400 mg total) by mouth once.  2 tablet  0   FM Hx- Negative for CAD or DM  History   Social History  . Marital Status: Single    Spouse Name: N/A    Number of Children: N/A  . Years of Education: N/A   Occupational History  . Not on file.   Social History Main Topics  . Smoking status: Former Smoker -- 0.50 packs/day    Types: Cigarettes    Quit date: 01/29/2013  . Smokeless tobacco: Not on file     Comment: pt rolls own cigarettes no filter smokes about 5  . Alcohol Use: Yes     Comment: 2-3 beers monthly  . Drug Use: No  . Sexual Activity: Not on file   Other Topics Concern  . Not on file   Social History Narrative  . No narrative on file     Review of Systems: General: negative for chills, fever, night sweats or weight changes.  Cardiovascular: negative for dyspnea on exertion, edema, orthopnea, palpitations, paroxysmal nocturnal dyspnea or shortness of breath Dermatological: negative for rash Respiratory: negative for cough or wheezing Urologic: negative for hematuria Abdominal: negative for nausea, vomiting, diarrhea, bright red blood per rectum, melena, or hematemesis Neurologic: negative for visual changes, syncope, or dizziness All other systems reviewed and are otherwise negative except as  noted above.  Physical Exam: Blood pressure 124/66, pulse 90, temperature 98.6 F (37 C), temperature source Oral, resp. rate 32, SpO2 98.00%.  General appearance: alert, cooperative and no distress Neck: no carotid bruit and no JVD Lungs: clear to auscultation bilaterally Heart: regular rate and rhythm and no rub heard Abdomen: soft, non-tender; bowel sounds normal; no masses,  no organomegaly Extremities: extremities normal, atraumatic, no cyanosis or edema Pulses: 2+ and symmetric Skin: Skin color, texture, turgor normal. No rashes or lesions Neurologic: Grossly normal HEENT: [place]/AT, EOMI, MMM, anicteric sclera  Labs:  No results found for  this or any previous visit (from the past 24 hour(s)).   Radiology/Studies: No results found.  EKG:NST with diffuse ST elevation  ASSESSMENT AND PLAN:  Principal Problem:   Pericarditis Active Problems:   Chest pain of pericarditis   Abnormal finding on EKG   ADD (attention deficit disorder)  PLAN: Echo and labs pending.  Deland Pretty, PA-C 07/31/2013, 1:02 PM  I have seen and evaluated the patient this PM along with Corine Shelter, PA upon initial arrival in the ER. I agree with his findings, examination as well as impression recommendations.  Previously healthy young man, besides smoking who presented to [facility] ER as a possible STEMI.  He notes off & on pressure in his upper chest ranging from collar bone to collar bone.  It has been off & on since yesterday.  It is actually worse lying flat & sitting up @ ~30-45degrees - better leaning forward.  Worse with inspiration.  NO radiation or associated dyspnea.    Exam is relatively benign.    ECG does indeed appear to have diffuse ST segment elevations with no apparent reciprocal depressions -- this has a Early Repolarization appearance & is suggestive of Pericarditis -- as is his symptoms.  He is currently relatively comfortable.    I have cancelled the STEMI activation.  We will admit for Echo, r/o MI with markers & initiate Rx for likely pericarditis.    Marykay Lex, M.D., M.S. [facility] [address] [place], [place]  [address]  [phone] Pager # [phone] 07/31/2013 3:24 PM

## 2013-07-31 NOTE — Progress Notes (Signed)
  Subjective:    Patient ID: Joseph Blankenship, male    DOB: 05/16/1976, 37 y.o.   MRN: [id number]  HPI 37 y.o male with chest pains, began yesterday. Room mate insisted he come here for treatment  Patient placed urgently in exam room due to complaints of chest pain, EKG performed Patient has been given Aspirin 324mg  at 11:56 Oxygen 2liters nasal canula IV placement Called for transport through 911   Review of Systems     Objective:   Physical Exam        Assessment & Plan:  EKG performed

## 2013-07-31 NOTE — Progress Notes (Signed)
Echocardiogram 2D Echocardiogram has been performed.  Arnola Crittendon 07/31/2013, 1:32 PM

## 2013-07-31 NOTE — ED Notes (Signed)
Echocardiogram being completed at bedside  

## 2013-07-31 NOTE — Progress Notes (Signed)
  Subjective:    Patient ID: Joseph Blankenship, male    DOB: October 11, 1976, 37 y.o.   MRN: [id number]  HPI Chest pain, emergency protocol started. Chest pain hx consistent with ACS, stat EKG reveals signs suggestive of acute ischemia. Unusual component of hx suggestive of worse pain when bends over or reclines. Is weak and sweaty. Oxygen and ASA given stat. EMTs called and arrived in 5 minutes. IV line placed  Review of Systems Quit smokes 8 months ago    Objective:   Physical Exam  Vitals reviewed. Constitutional: He is oriented to person, place, and time. He appears well-developed and well-nourished. He appears distressed.  HENT:  Head: Normocephalic.  Eyes: EOM are normal.  Neck: Normal range of motion.  Cardiovascular: Regular rhythm.  Tachycardia present.  Exam reveals gallop.   No murmur heard. Pulmonary/Chest: Effort normal and breath sounds normal. He exhibits tenderness.  Musculoskeletal: Normal range of motion.  Neurological: He is alert and oriented to person, place, and time. No cranial nerve deficit. He exhibits normal muscle tone. Coordination normal.  Skin: He is diaphoretic.  Psychiatric: His speech is normal and behavior is normal. Judgment and thought content normal. His mood appears anxious. Cognition and memory are normal.   EKG acute ischemia versus pericarditis       Assessment & Plan:  R/O ACS To [facility] cardiac EMT

## 2013-07-31 NOTE — ED Provider Notes (Signed)
CSN: [id number]     Arrival date & time 07/31/13  1235 History   First MD Initiated Contact with Patient 07/31/13 1241     Chief Complaint  Patient presents with  . Chest Pain   (Consider location/radiation/quality/duration/timing/severity/associated sxs/prior Treatment) HPI Comments: Patient complains of intermittent substernal chest pain is coming going for the past 2 days across his chest between his collar bones. Pain is better with leaning forward and worse with lying down. As associated with some shortness of breath and chest tightness. He was seen in urgent care it EKG suggested ST elevation and EMS was called. He denies any previous cardiac history. He denies any drug or alcohol abuse. He did have a upper respiratory infection 2 weeks ago. STEMI was called by EMS and cardiology is at bedside on arrival.  The history is provided by the patient.    Past Medical History  Diagnosis Date  . ADD (attention deficit disorder)    History reviewed. No pertinent past surgical history. History reviewed. No pertinent family history. History  Substance Use Topics  . Smoking status: Former Smoker -- 0.50 packs/day    Types: Cigarettes    Quit date: 01/29/2013  . Smokeless tobacco: Not on file     Comment: pt rolls own cigarettes no filter smokes about 5  . Alcohol Use: Yes     Comment: 2-3 beers monthly    Review of Systems  Constitutional: Negative for fever, activity change and appetite change.  HENT: Positive for rhinorrhea.   Eyes: Negative for visual disturbance.  Respiratory: Positive for chest tightness and shortness of breath. Negative for cough.   Cardiovascular: Positive for chest pain.  Gastrointestinal: Negative for nausea, vomiting and abdominal pain.  Genitourinary: Negative for dysuria and hematuria.  Musculoskeletal: Negative for back pain.  Skin: Negative for rash.  Neurological: Negative for dizziness, weakness and headaches.  A complete 10 system review of  systems was obtained and all systems are negative except as noted in the HPI and PMH.    Allergies  Bee pollen  Home Medications   Current Outpatient Rx  Name  Route  Sig  Dispense  Refill  . amphetamine-dextroamphetamine (ADDERALL) 30 MG tablet      Take one in the am, one at 3pm, and one in the evening (last dose if needed)   70 tablet   0     Fill on or after 07/28/13   . clonazePAM (KLONOPIN) 0.5 MG tablet   Oral   Take 0.5 mg by mouth 2 (two) times daily as needed for anxiety.         Marland Kitchen EPINEPHrine (EPIPEN 2-PAK) 0.3 mg/0.3 mL DEVI   Intramuscular   Inject 0.3 mLs (0.3 mg total) into the muscle once.   2 Device   1   . ibuprofen (ADVIL,MOTRIN) 200 MG tablet   Oral   Take 400 mg by mouth every 6 (six) hours as needed for pain or headache.          BP 124/66  Pulse 90  Temp(Src) 98.6 F (37 C) (Oral)  Resp 32  SpO2 98% Physical Exam  Constitutional: He is oriented to person, place, and time. He appears well-developed and well-nourished. No distress.  HENT:  Head: Normocephalic and atraumatic.  Mouth/Throat: Oropharynx is clear and moist. No oropharyngeal exudate.  Eyes: Conjunctivae and EOM are normal. Pupils are equal, round, and reactive to light.  Neck: Normal range of motion. Neck supple.  Cardiovascular: Normal rate, regular rhythm and normal  heart sounds.   No murmur heard. Pulmonary/Chest: Effort normal and breath sounds normal. No respiratory distress.  Abdominal: Soft. There is no tenderness. There is no rebound and no guarding.  Musculoskeletal: Normal range of motion. He exhibits no edema and no tenderness.  Neurological: He is alert and oriented to person, place, and time. No cranial nerve deficit. He exhibits normal muscle tone. Coordination normal.  Skin: Skin is warm.    ED Course  Procedures (including critical care time) Labs Review Labs Reviewed  CBC WITH DIFFERENTIAL - Abnormal; Notable for the following:    WBC 11.4 (*)    MCHC  36.4 (*)    Monocytes Absolute 1.1 (*)    All other components within normal limits  COMPREHENSIVE METABOLIC PANEL - Abnormal; Notable for the following:    Alkaline Phosphatase 123 (*)    All other components within normal limits  TROPONIN I  D-DIMER, QUANTITATIVE  PROTIME-INR  TSH   Imaging Review Dg Chest 2 View  07/31/2013   CLINICAL DATA:  Shortness of breath.  EXAM: CHEST  2 VIEW  COMPARISON:  None.  FINDINGS: The heart size and mediastinal contours are within normal limits. Both lungs are clear. The visualized skeletal structures are unremarkable.  IMPRESSION: No active cardiopulmonary disease.   Electronically Signed   By: Jerene Dilling M.D.   On: 07/31/2013 13:50    EKG Interpretation     Ventricular Rate:  92 PR Interval:  129 QRS Duration: 82 QT Interval:  343 QTC Calculation: 425 R Axis:   76 Text Interpretation:  Sinus rhythm ST elevation suggests acute pericarditis PR depression, diffuse ST elevation            MDM   1. Pericarditis   2. Chest pain of pericarditis   3. Abnormal finding on EKG    Intermittent substernal chest pain for the past 2 days. STEMI called by EMS. NO SOB, cough, fever.  EKG appears to show pericarditis changes.  STEMI cancelled. STEMI activation canceled in conjunction with Dr. Herbie Baltimore at bedside.  Troponin negative, D-dimer negative.  CXR negative. Cardiology has arranged for bedside echo. They plan to admit patient for observation.     Glynn Octave, MD 07/31/13 [phone]

## 2013-07-31 NOTE — Patient Instructions (Signed)
Acute Coronary Syndrome °Acute coronary syndrome (ACS) is an urgent problem in which the blood and oxygen supply to the heart is critically deficient. ACS requires hospitalization because one or more coronary arteries may be blocked. °ACS represents a range of conditions including: °· Previous angina that is now unstable, lasts longer, happens at rest, or is more intense. °· A heart attack, with heart muscle cell injury and death. °There are three vital coronary arteries that supply the heart muscle with blood and oxygen so that it can pump blood effectively. If blockages to these arteries develop, blood flow to the heart muscle is reduced. If the heart does not get enough blood, angina may occur as the first warning sign. °SYMPTOMS  °· The most common signs of angina include: °· Tightness or squeezing in the chest. °· Feeling of heaviness on the chest. °· Discomfort in the arms, neck, or jaw. °· Shortness of breath and nausea. °· Cold, wet skin. °· Angina is usually brought on by physical effort or excitement which increase the oxygen needs of the heart. These states increase the blood flow needs of the heart beyond what can be delivered. °TREATMENT  °· Medicines to help discomfort may include nitroglycerin (nitro) in the form of tablets or a spray for rapid relief, or longer-acting forms such as cream, patches, or capsules. (Be aware that there are many side effects and possible interactions with other drugs). °· Other medicines may be used to help the heart pump better. °· Procedures to open blocked arteries including angioplasty or stent placement to keep the arteries open. °· Open heart surgery may be needed when there are many blockages or they are in critical locations that are best treated with surgery. °HOME CARE INSTRUCTIONS  °· Avoid smoking. °· Take one baby or adult aspirin daily, if your caregiver advises. This helps reduce the risk of a heart attack. °· It is very important that you follow the angina  treatment prescribed by your caregiver. Make arrangements for proper follow-up care. °· Eat a heart healthy diet with salt and fat restrictions as advised. °· Regular exercise is good for you as long as it does not cause discomfort. Do not begin any new type of exercise until you check with your caregiver. °· If you are overweight, you should lose weight. °· Try to maintain normal blood lipid levels. °· Keep your blood pressure under control as recommended by your caregiver. °· You should tell your caregiver right away about any increase in the severity or frequency of your chest discomfort or angina attacks. When you have angina, you should stop what you are doing and sit down. This may bring relief in 3 to 5 minutes. If your caregiver has prescribed nitro, take it as directed. °· If your caregiver has given you a follow-up appointment, it is very important to keep that appointment. Not keeping the appointment could result in a chronic or permanent injury, pain, and disability. If there is any problem keeping the appointment, you must call back to this facility for assistance. °SEEK IMMEDIATE MEDICAL CARE IF:  °· You develop nausea, vomiting, or shortness of breath. °· You feel faint, lightheaded, or pass out. °· Your chest discomfort gets worse. °· You are sweating or experience sudden profound fatigue. °· You do not get relief of your chest pain after 3 doses of nitro. °· Your discomfort lasts longer than 15 minutes. °MAKE SURE YOU:  °· Understand these instructions. °· Will watch your condition. °· Will get help   right away if you are not doing well or get worse. °Document Released: 10/01/2005 Document Revised: 12/24/2011 Document Reviewed: 05/04/2008 °[employer]® Patient Information ©2014 [employer]. ° °

## 2013-08-01 ENCOUNTER — Other Ambulatory Visit: Payer: Self-pay

## 2013-08-01 LAB — TROPONIN I: Troponin I: <0.3 ng/mL (ref <0.30)

## 2013-08-01 MED ORDER — NAPROXEN SODIUM 220 MG PO TABS: 440.0000 mg | ORAL_TABLET | Freq: Two times a day (BID) | ORAL | Status: AC

## 2013-08-01 MED ORDER — ACETAMINOPHEN 325 MG PO TABS: 650.0000 mg | ORAL_TABLET | ORAL | Status: AC | PRN

## 2013-08-01 MED ORDER — OMEPRAZOLE MAGNESIUM 20 MG PO TBEC: 20.0000 mg | DELAYED_RELEASE_TABLET | Freq: Every day | ORAL | Status: AC

## 2013-08-01 NOTE — Discharge Summary (Signed)
Patient ID: Joseph Blankenship,  MRN: [id number], DOB/AGE: 1976/01/18 37 y.o.  Admit date: 07/31/2013 Discharge date: 08/01/2013  Primary Care Provider:  Primary Cardiologist: Dr Herbie Baltimore  Discharge Diagnoses Principal Problem:   Pericarditis Active Problems:   Chest pain of pericarditis   Abnormal finding on EKG   ADD (attention deficit disorder)    Hospital Course:  37 y/o brought to the ER 07/31/13 from [facility] [place] as a STEMI. In the ER at [facility] STEMI was aborted after Dr Herbie Baltimore saw the pt on arrival and reviewing the EKG. It was felt the pt had pericarditis. He was admitted for observation and ruled out for an MI. Echo shows no pericardial effusion and NL LVF. On the morning of 08/01/13 he is pain free. The plan is for discharge on two weeks on NSAIDs and a PPI. We'll see him back in two weeks.   Discharge Vitals:  Blood pressure 106/60, pulse 76, temperature 97.6 F (36.4 C), temperature source Oral, resp. rate 16, SpO2 96.00%.    Labs: Results for orders placed during the hospital encounter of 07/31/13 (from the past 48 hour(s))  CBC WITH DIFFERENTIAL     Status: Abnormal   Collection Time    07/31/13 12:41 PM      Result Value Range   WBC 11.4 (*) 4.0 - 10.5 K/uL   RBC 5.23  4.22 - 5.81 MIL/uL   Hemoglobin 16.5  13.0 - 17.0 g/dL   HCT 40.9  81.1 - 91.4 %   MCV 86.6  78.0 - 100.0 fL   MCH 31.5  26.0 - 34.0 pg   MCHC 36.4 (*) 30.0 - 36.0 g/dL   RDW 78.2  95.6 - 21.3 %   Platelets 257  150 - 400 K/uL   Neutrophils Relative % 62  43 - 77 %   Neutro Abs 7.1  1.7 - 7.7 K/uL   Lymphocytes Relative 23  12 - 46 %   Lymphs Abs 2.6  0.7 - 4.0 K/uL   Monocytes Relative 10  3 - 12 %   Monocytes Absolute 1.1 (*) 0.1 - 1.0 K/uL   Eosinophils Relative 4  0 - 5 %   Eosinophils Absolute 0.5  0.0 - 0.7 K/uL   Basophils Relative 1  0 - 1 %   Basophils Absolute 0.1  0.0 - 0.1 K/uL  COMPREHENSIVE METABOLIC PANEL     Status: Abnormal   Collection Time    07/31/13 12:41 PM       Result Value Range   Sodium 138  135 - 145 mEq/L   Potassium 4.3  3.5 - 5.1 mEq/L   Chloride 102  96 - 112 mEq/L   CO2 23  19 - 32 mEq/L   Glucose, Bld 83  70 - 99 mg/dL   BUN 15  6 - 23 mg/dL   Creatinine, Ser 0.86  0.50 - 1.35 mg/dL   Calcium 9.6  8.4 - 57.8 mg/dL   Total Protein 7.8  6.0 - 8.3 g/dL   Albumin 4.2  3.5 - 5.2 g/dL   AST 19  0 - 37 U/L   ALT 14  0 - 53 U/L   Alkaline Phosphatase 123 (*) 39 - 117 U/L   Total Bilirubin 0.3  0.3 - 1.2 mg/dL   GFR calc non Af Amer >90  >90 mL/min   GFR calc Af Amer >90  >90 mL/min   Comment: (NOTE)     The eGFR has been calculated using the  CKD EPI equation.     This calculation has not been validated in all clinical situations.     eGFR's persistently <90 mL/min signify possible Chronic Kidney     Disease.  TROPONIN I     Status: None   Collection Time    07/31/13 12:41 PM      Result Value Range   Troponin I <0.30  <0.30 ng/mL   Comment:            Due to the release kinetics of cTnI,     a negative result within the first hours     of the onset of symptoms does not rule out     myocardial infarction with certainty.     If myocardial infarction is still suspected,     repeat the test at appropriate intervals.  D-DIMER, QUANTITATIVE     Status: None   Collection Time    07/31/13 12:41 PM      Result Value Range   D-Dimer, Quant <0.27  0.00 - 0.48 ug/mL-FEU   Comment:            AT THE INHOUSE ESTABLISHED CUTOFF     VALUE OF 0.48 ug/mL FEU,     THIS ASSAY HAS BEEN DOCUMENTED     IN THE LITERATURE TO HAVE     A SENSITIVITY AND NEGATIVE     PREDICTIVE VALUE OF AT LEAST     98 TO 99%.  THE TEST RESULT     SHOULD BE CORRELATED WITH     AN ASSESSMENT OF THE CLINICAL     PROBABILITY OF DVT / VTE.  PROTIME-INR     Status: None   Collection Time    07/31/13 12:41 PM      Result Value Range   Prothrombin Time 12.7  11.6 - 15.2 seconds   INR 0.97  0.00 - 1.49  TSH     Status: None   Collection Time    07/31/13 12:41 PM       Result Value Range   TSH 0.683  0.350 - 4.500 uIU/mL   Comment: Performed at [facility]  TROPONIN I     Status: None   Collection Time    07/31/13  6:45 PM      Result Value Range   Troponin I <0.30  <0.30 ng/mL   Comment:            Due to the release kinetics of cTnI,     a negative result within the first hours     of the onset of symptoms does not rule out     myocardial infarction with certainty.     If myocardial infarction is still suspected,     repeat the test at appropriate intervals.  TROPONIN I     Status: None   Collection Time    08/01/13  5:50 AM      Result Value Range   Troponin I <0.30  <0.30 ng/mL   Comment:            Due to the release kinetics of cTnI,     a negative result within the first hours     of the onset of symptoms does not rule out     myocardial infarction with certainty.     If myocardial infarction is still suspected,     repeat the test at appropriate intervals.    Disposition:  Follow-up Information   Follow up with Corine Shelter K, PA-C. (  office will call you)    Specialty:  Cardiology   Contact information:   [address] [place] [address] [phone]       Discharge Medications:    Medication List    STOP taking these medications       ibuprofen 200 MG tablet  Commonly known as:  ADVIL,MOTRIN      TAKE these medications       acetaminophen 325 MG tablet  Commonly known as:  TYLENOL  Take 2 tablets (650 mg total) by mouth every 4 (four) hours as needed.     amphetamine-dextroamphetamine 30 MG tablet  Commonly known as:  ADDERALL  Take one in the am, one at 3pm, and one in the evening (last dose if needed)     clonazePAM 0.5 MG tablet  Commonly known as:  KLONOPIN  Take 0.5 mg by mouth 2 (two) times daily as needed for anxiety.     EPINEPHrine 0.3 mg/0.3 mL Devi  Commonly known as:  EPIPEN 2-PAK  Inject 0.3 mLs (0.3 mg total) into the muscle once.     naproxen sodium 220 MG tablet   Commonly known as:  ALEVE  Take 2 tablets (440 mg total) by mouth 2 (two) times daily with a meal.     omeprazole 20 MG tablet  Commonly known as:  PRILOSEC OTC  Take 1 tablet (20 mg total) by mouth daily.         Duration of Discharge Encounter: Greater than 30 minutes including physician time.  Jolene Provost PA-C 08/01/2013 9:31 AM

## 2013-08-01 NOTE — Discharge Summary (Signed)
Pt seen & examined - agree with plan for d/c.  Marykay Lex, MD

## 2013-08-01 NOTE — Progress Notes (Signed)
I have seen and evaluated the patient this AM along with Corine Shelter, PA. I agree with his findings, examination as well as impression recommendations.  Stable O/N with no active CP Sx.  Echo relatively normal; Exam benign  Agree with plan for d/c on 2 week course of NSAIDs --> ROV with Mr. Diona Fanti.  Counseled pt to take NSAIDs with meals & to ensure adequate hydration.  MD Time with pt: 15 min  HARDING,DAVID W, M.D., M.S. [facility] [address] [place], [place]  [address]  [phone] Pager # [phone] 08/01/2013 10:03 AM

## 2013-08-01 NOTE — Progress Notes (Signed)
Subjective:  No chest pain  Objective:  Vital Signs in the last 24 hours: Temp:  [97.6 F (36.4 C)-98.8 F (37.1 C)] 97.6 F (36.4 C) (10/18 0500) Pulse Rate:  [76-106] 76 (10/18 0500) Resp:  [16-32] 16 (10/18 0500) BP: (106-125)/(60-80) 106/60 mmHg (10/18 0500) SpO2:  [96 %-99 %] 96 % (10/18 0500) Weight:  [179 lb (81.194 kg)] 179 lb (81.194 kg) (10/17 1223)  Intake/Output from previous day:  Intake/Output Summary (Last 24 hours) at 08/01/13 0925 Last data filed at 08/01/13 0855  Gross per 24 hour  Intake    360 ml  Output      0 ml  Net    360 ml    Physical Exam: General appearance: alert, cooperative and no distress Lungs: clear to auscultation bilaterally Heart: regular rate and rhythm and no rub   Rate: 76  Rhythm: normal sinus rhythm  Lab Results:  Recent Labs  07/31/13 1241  WBC 11.4*  HGB 16.5  PLT 257    Recent Labs  07/31/13 1241  NA 138  K 4.3  CL 102  CO2 23  GLUCOSE 83  BUN 15  CREATININE 0.82    Recent Labs  07/31/13 1845 08/01/13 0550  TROPONINI <0.30 <0.30    Recent Labs  07/31/13 1241  INR 0.97    Imaging: Imaging results have been reviewed  Cardiac Studies: NL LVF, no effusion  Assessment/Plan:   Principal Problem:   Pericarditis Active Problems:   Chest pain of pericarditis   Abnormal finding on EKG   ADD (attention deficit disorder)    PLAN: Discharge on two weeks of NSAID and PPI. Follow up and if no further problems we can see prn.  Corine Shelter PA-C Beeper [phone] 08/01/2013, 9:25 AM

## 2013-08-20 ENCOUNTER — Other Ambulatory Visit: Payer: Self-pay

## 2013-09-21 ENCOUNTER — Ambulatory Visit: Payer: 59 | Admitting: Family Medicine

## 2013-10-12 ENCOUNTER — Ambulatory Visit (INDEPENDENT_AMBULATORY_CARE_PROVIDER_SITE_OTHER): Payer: 59 | Admitting: Family Medicine

## 2013-10-12 ENCOUNTER — Encounter: Payer: Self-pay | Admitting: Family Medicine

## 2013-10-12 VITALS — BP 123/79 | HR 102 | Temp 98.2°F | Resp 17 | Ht 70.5 in | Wt 205.0 lb

## 2013-10-12 DIAGNOSIS — F988 Other specified behavioral and emotional disorders with onset usually occurring in childhood and adolescence: Secondary | ICD-10-CM

## 2013-10-12 MED ORDER — AMPHETAMINE-DEXTROAMPHETAMINE 30 MG PO TABS: 30.0000 mg | ORAL_TABLET | Freq: Two times a day (BID) | ORAL | Status: DC

## 2013-10-12 NOTE — Progress Notes (Signed)
Subjective:    Patient ID: Joseph Blankenship, male    DOB: 10/31/1975, 37 y.o.   MRN: [id number]  HPI Joseph Blankenship is a 37 y.o. male  Last ov in June of this year.  Since then was hospitalized with chest pain - pericarditis 10/17-10/18/2014. No pericardial effusion on echo nl LVF, ruled out for MI. rx 2 weeks NSIADs and PPI, pain resolved.    ADD- taking Adderall 30mg  1 in am, 1 in early afternoon only now. No new side effects. No recent chest pains. Good appetite, no insomnia. No palpitations. Feels well otherwise. Helps with focus, projects, and working on resume, etc. Trying to wean back.    Anxiety - improved.  Has not had to take Klonopin in months.  Meditation has helped and quit smoking. Less anxious. Application out for jobs. Still working at [employer]. Applying for [employer]     Patient Active Problem List   Diagnosis Date Noted  . Pericarditis 07/31/2013  . Chest pain of pericarditis 07/31/2013  . Abnormal finding on EKG 07/31/2013  . ADD (attention deficit disorder) 07/31/2013   Past Medical History  Diagnosis Date  . ADD (attention deficit disorder)    No past surgical history on file. Allergies  Allergen Reactions  . Bee Pollen    Prior to Admission medications   Medication Sig Start Date End Date Taking? Authorizing Provider  acetaminophen (TYLENOL) 325 MG tablet Take 2 tablets (650 mg total) by mouth every 4 (four) hours as needed. 08/01/13  Yes Luke K Kilroy, PA-C  amphetamine-dextroamphetamine (ADDERALL) 30 MG tablet Take one in the am, one at 3pm, and one in the evening (last dose if needed) 06/24/13  Yes Eleanore E Egan, PA-C  clonazePAM (KLONOPIN) 0.5 MG tablet Take 0.5 mg by mouth 2 (two) times daily as needed for anxiety.   Yes Historical Provider, MD  EPINEPHrine (EPIPEN 2-PAK) 0.3 mg/0.3 mL DEVI Inject 0.3 mLs (0.3 mg total) into the muscle once. 04/04/12  Yes Shade Flood, MD  omeprazole (PRILOSEC OTC) 20 MG tablet Take 1 tablet (20  mg total) by mouth daily. 08/01/13 10/12/13 Yes Abelino Derrick, PA-C   History   Social History  . Marital Status: Single    Spouse Name: N/A    Number of Children: N/A  . Years of Education: N/A   Occupational History  . Not on file.   Social History Main Topics  . Smoking status: Former Smoker -- 0.50 packs/day    Types: Cigarettes    Quit date: 01/29/2013  . Smokeless tobacco: Not on file     Comment: pt rolls own cigarettes no filter smokes about 5  . Alcohol Use: Yes     Comment: 2-3 beers monthly  . Drug Use: No  . Sexual Activity: Not on file   Other Topics Concern  . Not on file   Social History Narrative  . No narrative on file    Review of Systems  Constitutional: Negative for appetite change and unexpected weight change.  Respiratory: Negative for chest tightness.   Cardiovascular: Negative for chest pain and palpitations.  Psychiatric/Behavioral: Negative for sleep disturbance and dysphoric mood. The patient is not nervous/anxious.        Objective:   Physical Exam  Vitals reviewed. Constitutional: He is oriented to person, place, and time. He appears well-developed and well-nourished.  HENT:  Head: Normocephalic and atraumatic.  Eyes: EOM are normal. Pupils are equal, round, and reactive to light.  Cardiovascular: Normal rate, regular rhythm, normal heart sounds and intact distal pulses.   No extrasystoles are present.  No murmur heard. Pulmonary/Chest: Effort normal and breath sounds normal.  Neurological: He is alert and oriented to person, place, and time.  Skin: Skin is warm and dry.  Psychiatric: He has a normal mood and affect. His behavior is normal. Judgment and thought content normal.   Filed Vitals:   10/12/13 1411  BP: 123/79  Pulse: 102  Temp: 98.2 F (36.8 C)  TempSrc: Oral  Resp: 17  Height: 5' 10.5" (1.791 m)  Weight: 205 lb (92.987 kg)  SpO2: 96%        Assessment & Plan:   Joseph Blankenship is a 37 y.o. male ADD  (attention deficit disorder) - Plan: amphetamine-dextroamphetamine (ADDERALL) 30 MG tablet, DISCONTINUED: amphetamine-dextroamphetamine (ADDERALL) 30 MG tablet, DISCONTINUED: amphetamine-dextroamphetamine (ADDERALL) 30 MG tablet  Well controlled and now only requiring BID dosing. Refilled for 3 months, call for next 3, plan on recheck in 6 months.  Hx of chest pain, pericarditis - resolved, asx now.   Anxiety - stable, no recent need for Klonopin - cont coping techniques.   Meds ordered this encounter  Medications  . DISCONTD: amphetamine-dextroamphetamine (ADDERALL) 30 MG tablet    Sig: Take 1 tablet (30 mg total) by mouth 2 (two) times daily.    Dispense:  60 tablet    Refill:  0  . DISCONTD: amphetamine-dextroamphetamine (ADDERALL) 30 MG tablet    Sig: Take 1 tablet (30 mg total) by mouth 2 (two) times daily.    Dispense:  60 tablet    Refill:  0    May fill 30 days after date on prescription.  Marland Kitchen amphetamine-dextroamphetamine (ADDERALL) 30 MG tablet    Sig: Take 1 tablet (30 mg total) by mouth 2 (two) times daily.    Dispense:  60 tablet    Refill:  0    May fill 60 days after date on prescription.   There are no Patient Instructions on file for this visit.

## 2014-01-11 ENCOUNTER — Ambulatory Visit: Payer: 59 | Admitting: Family Medicine

## 2014-01-11 ENCOUNTER — Encounter: Payer: Self-pay | Admitting: Family Medicine

## 2014-01-11 ENCOUNTER — Ambulatory Visit (INDEPENDENT_AMBULATORY_CARE_PROVIDER_SITE_OTHER): Payer: 59 | Admitting: Family Medicine

## 2014-01-11 VITALS — BP 118/85 | HR 97 | Temp 98.3°F | Resp 16 | Ht 70.0 in | Wt 207.0 lb

## 2014-01-11 DIAGNOSIS — F988 Other specified behavioral and emotional disorders with onset usually occurring in childhood and adolescence: Secondary | ICD-10-CM

## 2014-01-11 MED ORDER — AMPHETAMINE-DEXTROAMPHETAMINE 30 MG PO TABS: 30.0000 mg | ORAL_TABLET | Freq: Two times a day (BID) | ORAL | Status: DC

## 2014-01-11 MED ORDER — AMPHETAMINE-DEXTROAMPHETAMINE 30 MG PO TABS
30.0000 mg | ORAL_TABLET | Freq: Two times a day (BID) | ORAL | Status: DC
Start: 2014-01-11 — End: 2014-04-07

## 2014-01-11 NOTE — Patient Instructions (Signed)
Good to see you today. Please give me a call in 3 months for 3 more rx, recheck here in 6 months.

## 2014-01-11 NOTE — Progress Notes (Signed)
[facility] [address] [place] [address] [phone]  Date:  01/11/2014   Name:  Joseph Blankenship   DOB:  [birth year]   MRN:  [id number]  PCP:  No PCP Per Patient    Chief Complaint: Medication Refill   History of Present Illness:  Joseph Blankenship is a 38 y.o. very pleasant male patient who presents with the following:  Past history of pericarditis; now fully recovered. This was thought to be triggered by a viral infection.    He uses adderall for his ADHD.   He has used adderall for about 4 years. However looking back he sees signs of ADD in himself since childhood.  He started medication when he began his GRE studies, then grad school, and now he is working towards taking the LAST next year.   He is better at studying with the medication, and he also does better at work.  He feels that his appetite is ok, he is sleeping well.    He is a Production designer, theatre/television/film at [employer]- he will be more busy at work coming up and hopes he will get more exercise.  He has gained a few lbs   He takes adderall 30 BID.  He had been on 30am and 15 pm  No further CP.   Patient Active Problem List   Diagnosis Date Noted  . Pericarditis 07/31/2013  . Chest pain of pericarditis 07/31/2013  . Abnormal finding on EKG 07/31/2013  . ADD (attention deficit disorder) 07/31/2013    Past Medical History  Diagnosis Date  . ADD (attention deficit disorder)     No past surgical history on file.  History  Substance Use Topics  . Smoking status: Former Smoker -- 0.50 packs/day    Types: Cigarettes    Quit date: 01/29/2013  . Smokeless tobacco: Not on file     Comment: pt rolls own cigarettes no filter smokes about 5  . Alcohol Use: Yes     Comment: 2-3 beers monthly    No family history on file.  Allergies  Allergen Reactions  . Bee Pollen     Medication list has been reviewed and updated.  Current Outpatient Prescriptions on File Prior to Visit  Medication Sig Dispense  Refill  . acetaminophen (TYLENOL) 325 MG tablet Take 2 tablets (650 mg total) by mouth every 4 (four) hours as needed.      Marland Kitchen amphetamine-dextroamphetamine (ADDERALL) 30 MG tablet Take 1 tablet (30 mg total) by mouth 2 (two) times daily.  60 tablet  0  . clonazePAM (KLONOPIN) 0.5 MG tablet Take 0.5 mg by mouth 2 (two) times daily as needed for anxiety.      Marland Kitchen EPINEPHrine (EPIPEN 2-PAK) 0.3 mg/0.3 mL DEVI Inject 0.3 mLs (0.3 mg total) into the muscle once.  2 Device  1  . omeprazole (PRILOSEC OTC) 20 MG tablet Take 1 tablet (20 mg total) by mouth daily.       No current facility-administered medications on file prior to visit.    Review of Systems:  As per HPI- otherwise negative.   Physical Examination: Filed Vitals:   01/11/14 1102  BP: 130/90  Pulse: 97  Temp: 98.3 F (36.8 C)  Resp: 16   Filed Vitals:   01/11/14 1102  Height: 5\' 10"  (1.778 m)  Weight: 207 lb (93.895 kg)   Body mass index is 29.7 kg/(m^2). Ideal Body Weight: Weight in (lb) to have BMI = 25: 173.9  GEN: WDWN,  NAD, Non-toxic, A & O x 3 HEENT: Atraumatic, Normocephalic. Neck supple. No masses, No LAD. Ears and Nose: No external deformity. CV: RRR, No M/G/R. No JVD. No thrill. No extra heart sounds. PULM: CTA B, no wheezes, crackles, rhonchi. No retractions. No resp. distress. No accessory muscle use.Marland Kitchen. EXTR: No c/c/e NEURO Normal gait.  PSYCH: Normally interactive. Conversant. Not depressed or anxious appearing.  Calm demeanor.    Assessment and Plan: ADD (attention deficit disorder) - Plan: amphetamine-dextroamphetamine (ADDERALL) 30 MG tablet, amphetamine-dextroamphetamine (ADDERALL) 30 MG tablet, amphetamine-dextroamphetamine (ADDERALL) 30 MG tablet  Adhd, controlled with adderall.  Refilled his medication for the next 3 months.   Plan recheck here in 6 months  Signed Abbe AmsterdamJessica Pricilla Moehle, MD

## 2014-01-14 ENCOUNTER — Other Ambulatory Visit: Payer: Self-pay | Admitting: Family Medicine

## 2014-01-14 DIAGNOSIS — F418 Other specified anxiety disorders: Secondary | ICD-10-CM

## 2014-01-15 NOTE — Telephone Encounter (Signed)
Done.  Ready for pick-up 

## 2014-01-17 NOTE — Telephone Encounter (Signed)
LMOM that rx has been faxed.

## 2014-04-06 ENCOUNTER — Telehealth: Payer: Self-pay

## 2014-04-06 DIAGNOSIS — F988 Other specified behavioral and emotional disorders with onset usually occurring in childhood and adolescence: Secondary | ICD-10-CM

## 2014-04-06 NOTE — Telephone Encounter (Signed)
Patient needs a refill for his adderall. States refill is supposed to be for 3 months of the medication. Please return call and advise.

## 2014-04-07 MED ORDER — AMPHETAMINE-DEXTROAMPHETAMINE 30 MG PO TABS: 30.0000 mg | ORAL_TABLET | Freq: Two times a day (BID) | ORAL | Status: DC

## 2014-04-13 ENCOUNTER — Other Ambulatory Visit: Payer: Self-pay | Admitting: Family Medicine

## 2014-04-13 DIAGNOSIS — F418 Other specified anxiety disorders: Secondary | ICD-10-CM

## 2014-04-15 NOTE — Telephone Encounter (Signed)
Refilled and ready for pickup.  

## 2014-04-15 NOTE — Telephone Encounter (Signed)
Faxed

## 2014-07-02 ENCOUNTER — Telehealth: Payer: Self-pay

## 2014-07-02 DIAGNOSIS — F988 Other specified behavioral and emotional disorders with onset usually occurring in childhood and adolescence: Secondary | ICD-10-CM

## 2014-07-02 NOTE — Telephone Encounter (Signed)
Refill on amphetamine-dextroamphetamine (ADDERALL) 30 MG tablet [[id number]]

## 2014-07-03 NOTE — Telephone Encounter (Signed)
Called and let him know I will RF this on Monday,this is ok with him

## 2014-07-05 MED ORDER — AMPHETAMINE-DEXTROAMPHETAMINE 30 MG PO TABS: 30.0000 mg | ORAL_TABLET | Freq: Two times a day (BID) | ORAL | Status: AC

## 2014-07-05 MED ORDER — AMPHETAMINE-DEXTROAMPHETAMINE 30 MG PO TABS
30.0000 mg | ORAL_TABLET | Freq: Two times a day (BID) | ORAL | Status: AC
Start: 2014-07-05 — End: ?

## 2014-07-12 ENCOUNTER — Telehealth: Payer: Self-pay

## 2014-07-12 NOTE — Telephone Encounter (Signed)
Patient called to see if his "Adderral" prescription was ready to be picked up.  I informed patient it was and he stated his friend Damien Fusi would be picking it up for him. He has moved out of town and is unable to get here to pick it up today. Any questions patients call back number is [phone]

## 2014-07-20 ENCOUNTER — Telehealth: Payer: Self-pay

## 2014-07-20 NOTE — Telephone Encounter (Signed)
PA needed for Adderall 30. Completed form and faxed to [facility]. Pending.

## 2014-07-30 ENCOUNTER — Other Ambulatory Visit: Payer: Self-pay

## 2014-09-03 NOTE — Telephone Encounter (Signed)
Called pharm to see if Rx ever went through. They advised pt paid OOP but checked to see if it would go through now and was advised it still needs a PA. Checked w/[facility] and they reported PA was approved through 07/21/15. Pharm was trying to fill too soon which is why it rejected. It should go through w/out problem when due. Notified pharm.

## 2015-02-23 IMAGING — CR DG CHEST 2V
2 series · 2 of 2 positions shown · non-contrast
Comparison: None.

CLINICAL DATA: Shortness of breath.

EXAM:
CHEST  2 VIEW

[w chest pa]
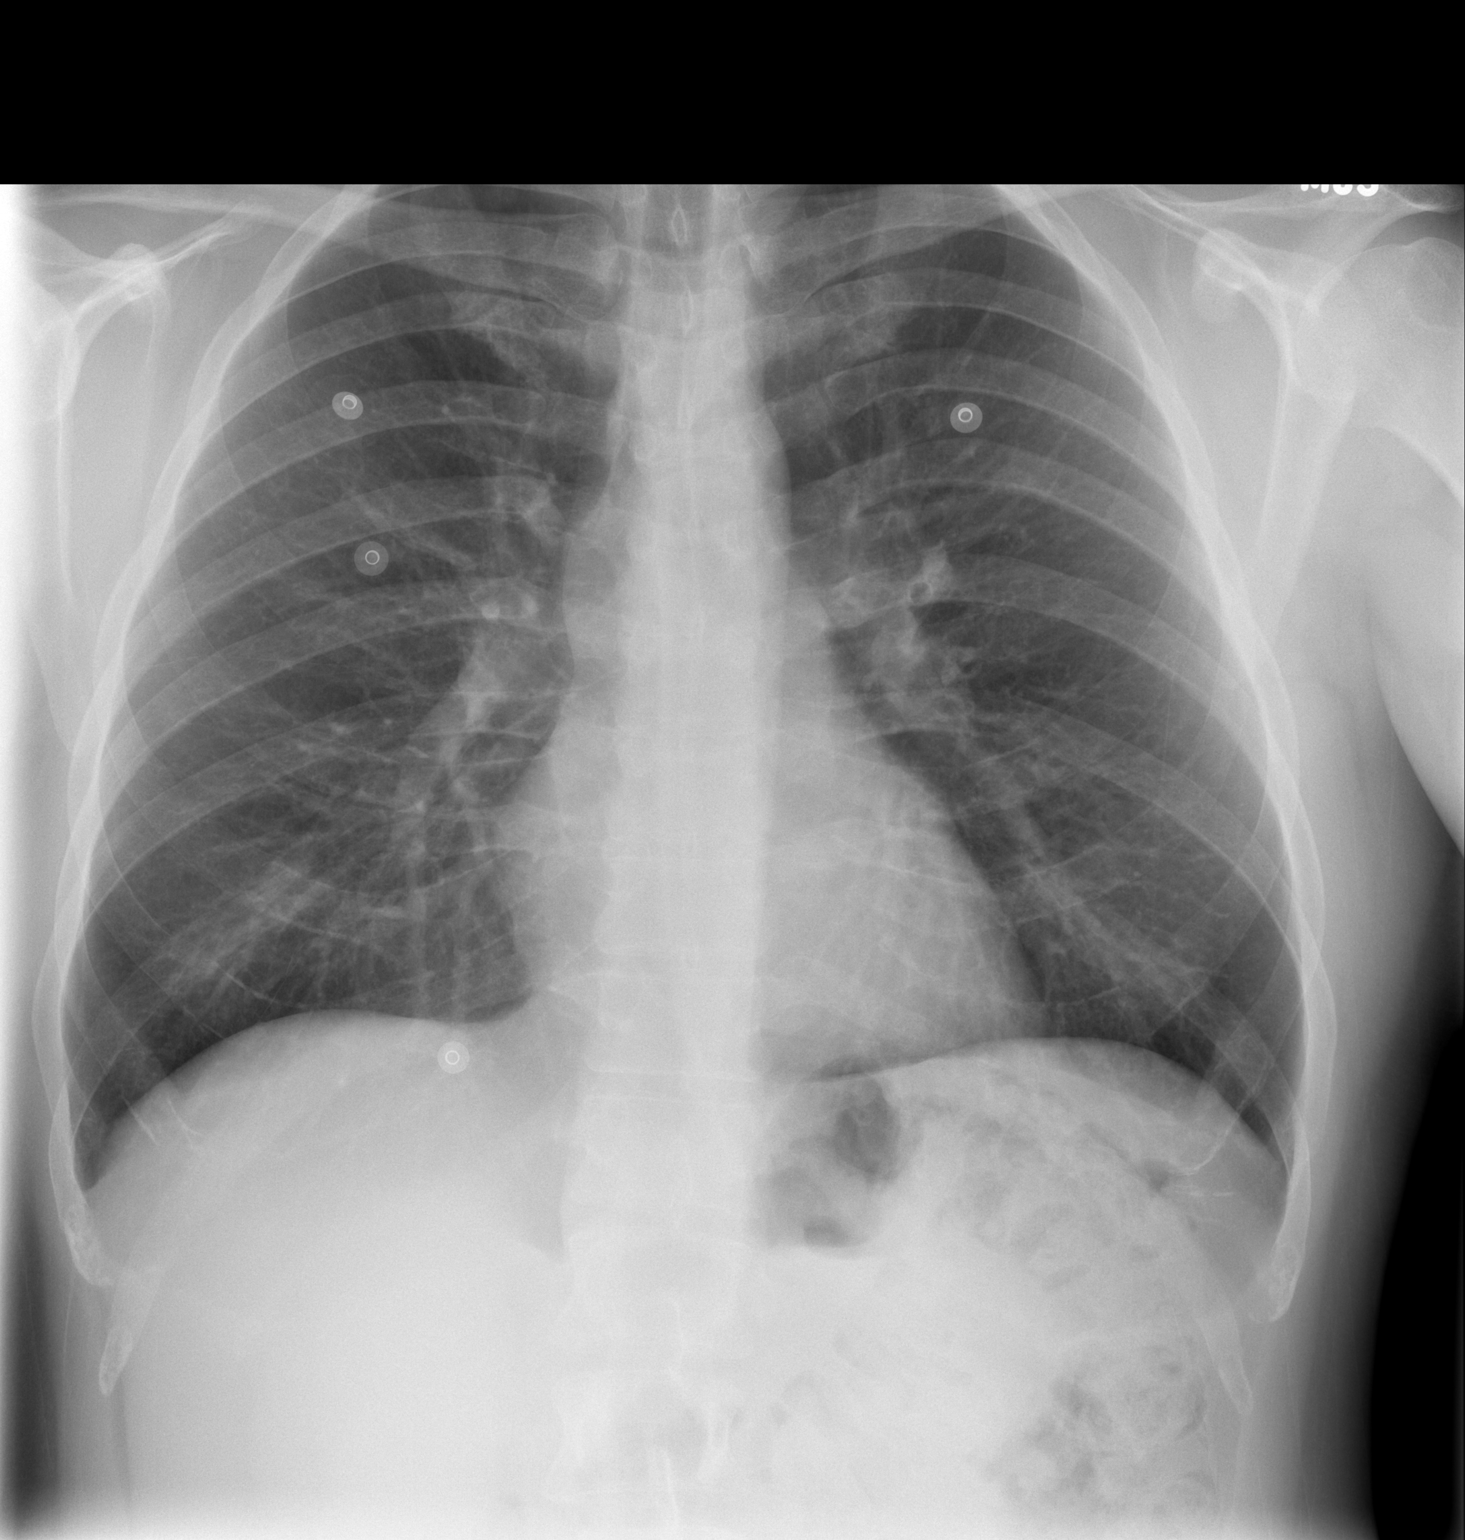

[w chest lat]
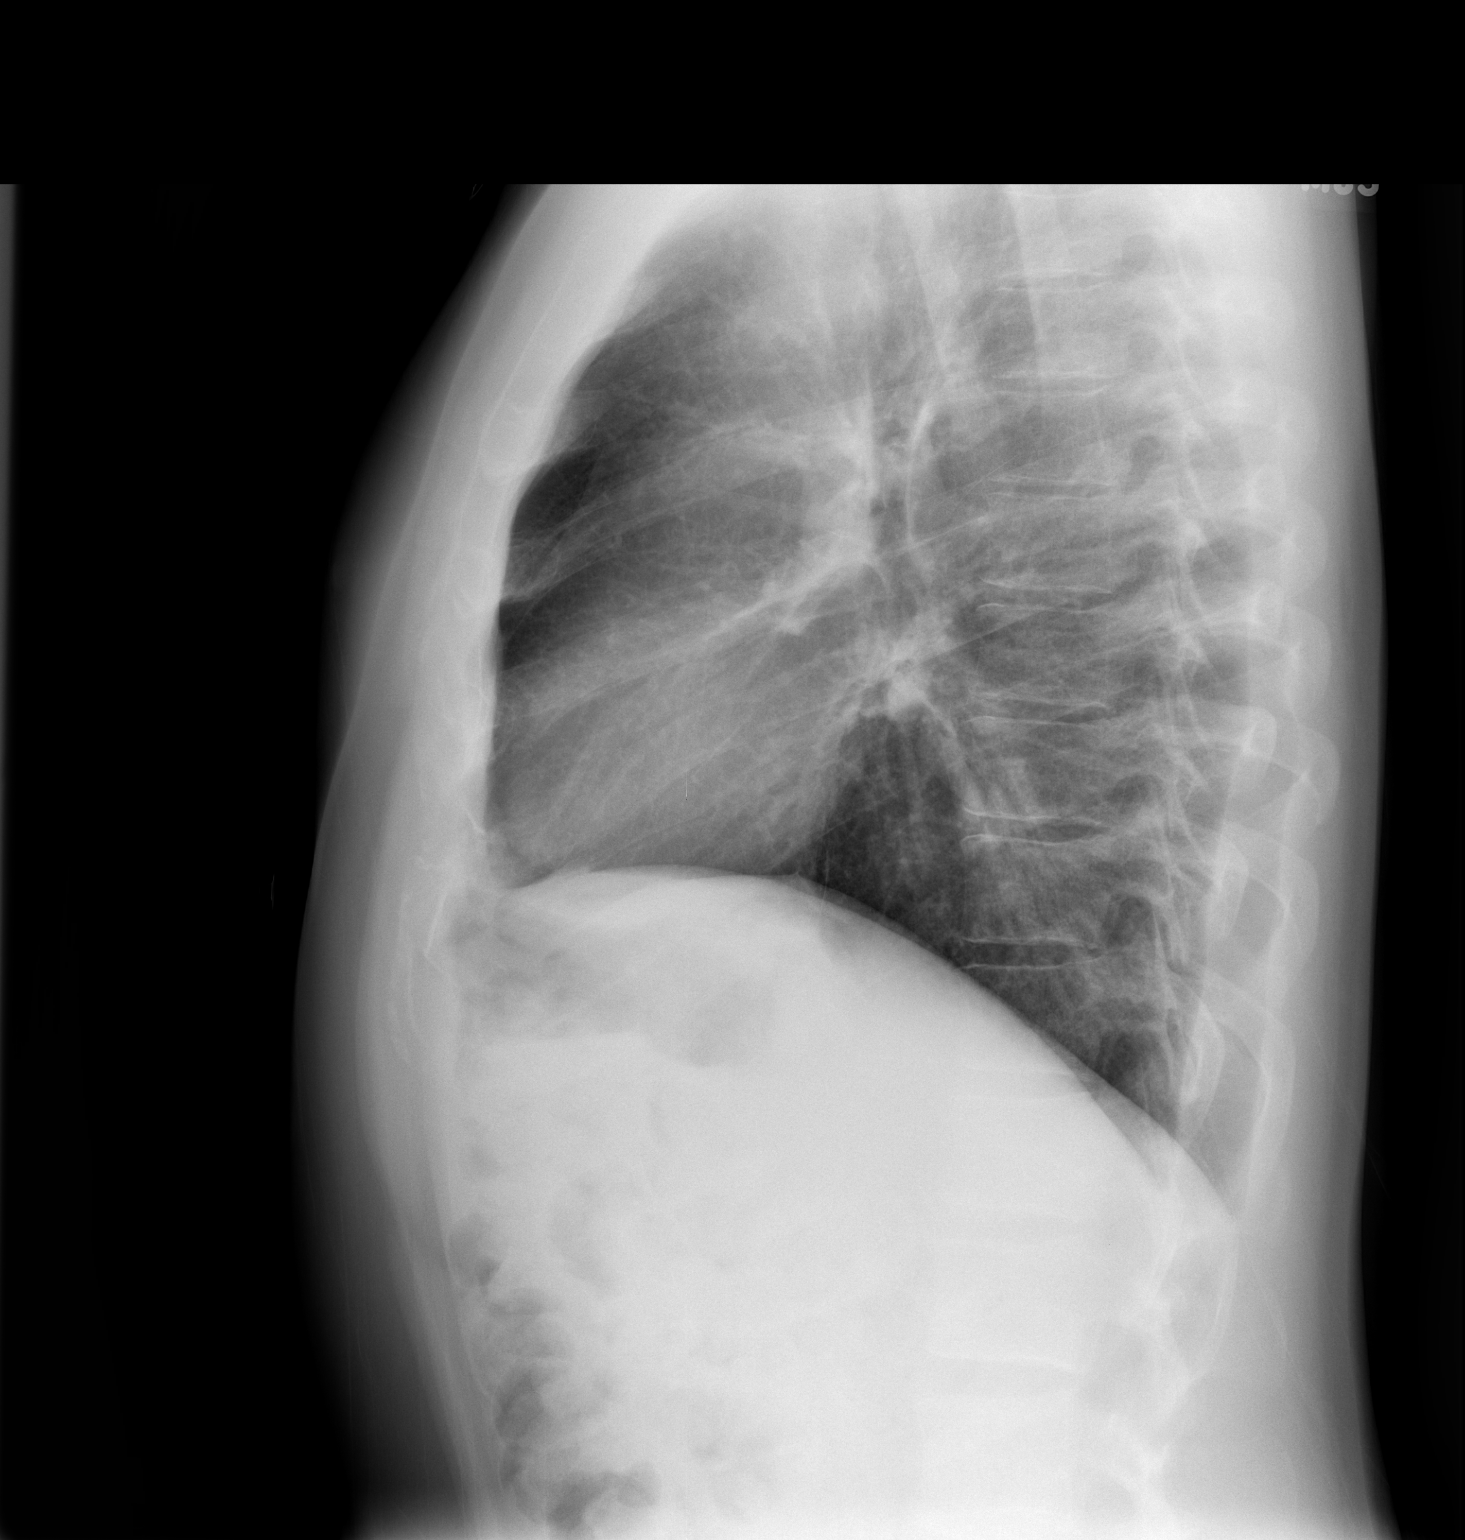

[2 of 2 positions shown; findings below may reference images not displayed]

FINDINGS: The heart size and mediastinal contours are within normal limits.
Both lungs are clear. The visualized skeletal structures are
unremarkable.
IMPRESSION: No active cardiopulmonary disease.
